# Patient Record
Sex: Female | Born: 1960 | Race: White | Hispanic: No | Smoking: Current some day smoker
Health system: Southern US, Community
[De-identification: ages and names within clinical notes are randomized; demographics above are authoritative.]

## PROBLEM LIST (undated history)

## (undated) DIAGNOSIS — K7460 Unspecified cirrhosis of liver: Secondary | ICD-10-CM

---

## 2004-10-30 ENCOUNTER — Encounter: Payer: Self-pay | Admitting: Family Medicine

## 2008-01-26 ENCOUNTER — Ambulatory Visit: Payer: Self-pay | Admitting: Family Medicine

## 2008-01-26 DIAGNOSIS — J449 Chronic obstructive pulmonary disease, unspecified: Secondary | ICD-10-CM

## 2008-01-26 DIAGNOSIS — B182 Chronic viral hepatitis C: Secondary | ICD-10-CM | POA: Insufficient documentation

## 2008-01-26 DIAGNOSIS — R519 Headache, unspecified: Secondary | ICD-10-CM | POA: Insufficient documentation

## 2008-01-26 DIAGNOSIS — F329 Major depressive disorder, single episode, unspecified: Secondary | ICD-10-CM

## 2008-01-26 DIAGNOSIS — R51 Headache: Secondary | ICD-10-CM

## 2008-01-26 DIAGNOSIS — K625 Hemorrhage of anus and rectum: Secondary | ICD-10-CM

## 2008-01-26 DIAGNOSIS — J4489 Other specified chronic obstructive pulmonary disease: Secondary | ICD-10-CM | POA: Insufficient documentation

## 2008-01-26 DIAGNOSIS — M549 Dorsalgia, unspecified: Secondary | ICD-10-CM | POA: Insufficient documentation

## 2008-01-26 DIAGNOSIS — J309 Allergic rhinitis, unspecified: Secondary | ICD-10-CM | POA: Insufficient documentation

## 2008-01-27 LAB — CONVERTED CEMR LAB
ALT: 96 units/L — ABNORMAL HIGH (ref 0–35)
AST: 115 units/L — ABNORMAL HIGH (ref 0–37)
Basophils Absolute: 0 10*3/uL (ref 0.0–0.1)
Basophils Relative: 0.7 % (ref 0.0–3.0)
CO2: 31 meq/L (ref 19–32)
Creatinine, Ser: 0.8 mg/dL (ref 0.4–1.2)
GFR calc Af Amer: 99 mL/min
Glucose, Bld: 87 mg/dL (ref 70–99)
Lymphocytes Relative: 32.8 % (ref 12.0–46.0)
MCHC: 35.4 g/dL (ref 30.0–36.0)
Monocytes Relative: 11.6 % (ref 3.0–12.0)
Neutro Abs: 1.8 10*3/uL (ref 1.4–7.7)
Platelets: 66 10*3/uL — ABNORMAL LOW (ref 150–400)
Potassium: 4.5 meq/L (ref 3.5–5.1)
Total Bilirubin: 1.1 mg/dL (ref 0.3–1.2)
WBC: 3.4 10*3/uL — ABNORMAL LOW (ref 4.5–10.5)

## 2008-02-13 ENCOUNTER — Ambulatory Visit: Payer: Self-pay | Admitting: Family Medicine

## 2008-02-15 ENCOUNTER — Telehealth (INDEPENDENT_AMBULATORY_CARE_PROVIDER_SITE_OTHER): Payer: Self-pay | Admitting: *Deleted

## 2008-04-03 ENCOUNTER — Encounter (INDEPENDENT_AMBULATORY_CARE_PROVIDER_SITE_OTHER): Payer: Self-pay | Admitting: *Deleted

## 2009-05-17 ENCOUNTER — Ambulatory Visit (HOSPITAL_COMMUNITY): Admission: RE | Admit: 2009-05-17 | Discharge: 2009-05-17 | Payer: Self-pay | Admitting: Neurosurgery

## 2009-05-27 ENCOUNTER — Observation Stay: Payer: Self-pay | Admitting: Student

## 2009-08-06 ENCOUNTER — Ambulatory Visit: Payer: Self-pay | Admitting: Pain Medicine

## 2009-08-09 ENCOUNTER — Ambulatory Visit: Payer: Self-pay | Admitting: Gastroenterology

## 2009-08-28 ENCOUNTER — Ambulatory Visit: Payer: Self-pay | Admitting: Internal Medicine

## 2009-09-18 ENCOUNTER — Ambulatory Visit: Payer: Self-pay | Admitting: Internal Medicine

## 2009-09-27 ENCOUNTER — Ambulatory Visit: Payer: Self-pay | Admitting: Internal Medicine

## 2010-08-29 ENCOUNTER — Ambulatory Visit: Payer: Self-pay | Admitting: Internal Medicine

## 2010-09-05 ENCOUNTER — Inpatient Hospital Stay: Payer: Self-pay | Admitting: Internal Medicine

## 2010-09-28 ENCOUNTER — Ambulatory Visit: Payer: Self-pay | Admitting: Internal Medicine

## 2011-10-19 IMAGING — CR DG CHEST 1V PORT
1 series · 1 of 1 positions shown · non-contrast
Comparison: none

REASON FOR EXAM: shortness of breath
COMMENTS:

[view not recorded]
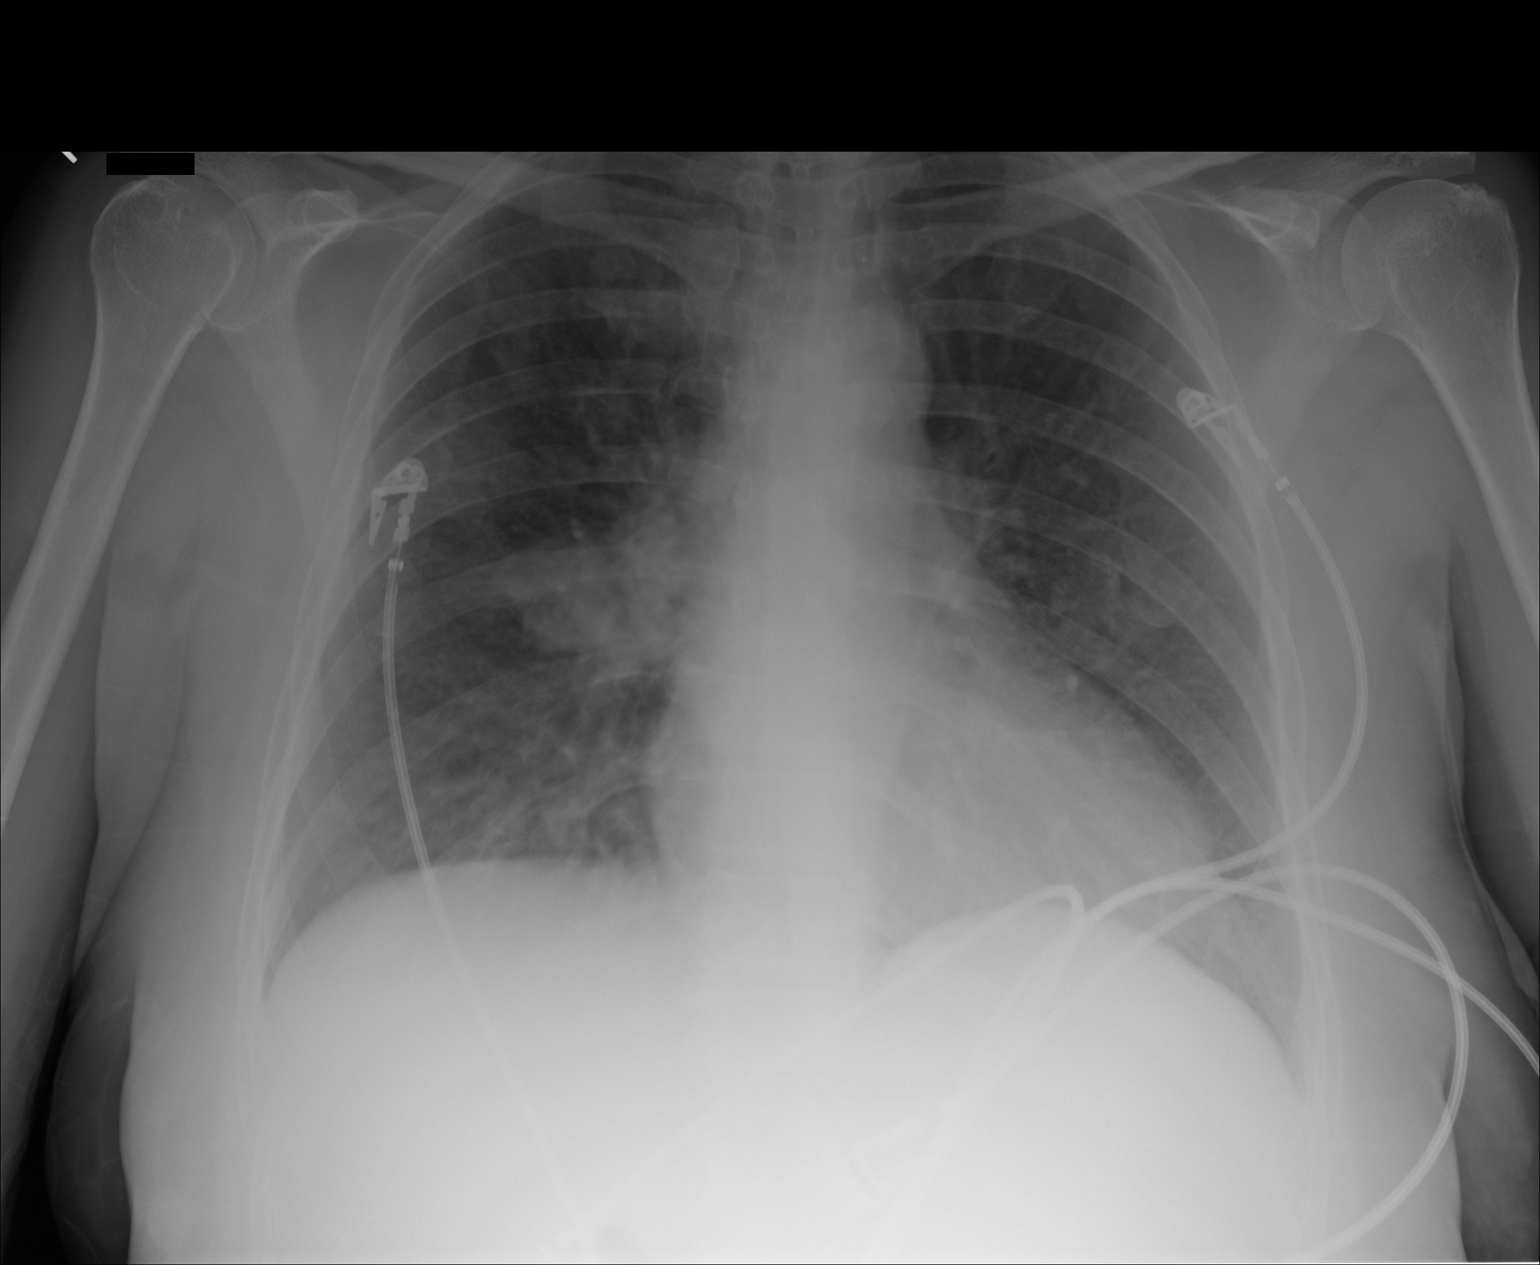

[1 of 1 positions shown; findings below may reference images not displayed]

PROCEDURE:     DXR - DXR PORTABLE CHEST SINGLE VIEW  - September 05, 2010  [DATE]

RESULT:     Comparison is made to the prior exam of 05/27/2009. There is
increased density about the right hilum. Pneumonia would be the first
consideration. Continued follow-up until clear is recommended. Heart size is
normal. Monitoring electrodes are present. No pleural effusion or frank
pulmonary edema is noted.
IMPRESSION: 1.     There is increased density about the right hilar area. Pneumonia
would be the first consideration as to etiology. Follow-up examination until
clear is recommended since underlying mass cannot be entirely excluded at
this point.

## 2011-10-19 IMAGING — CT CT CHEST W/ CM
1 of 2 series · 14 of 32 positions shown, 18 images · IV contrast (APPLIED)
Comparison: none

REASON FOR EXAM: hypoxia and respiratory distress
COMMENTS:

PROCEDURE:     CT  - CT CHEST (FOR PE) W  - September 05, 2010  [DATE]
RESULT:     Comparison: Portable chest radiograph performed same day
TECHNIQUE: Multiple thin section axial images were obtained from the lung
apices to the upper abdomen following 100 ml Isovue 370 intravenous
contrast, according to the PE protocol. These images were also reviewed on a
[employer] multiplanar work station.

[Series 5: lung windows · axial · 0.74mm/px · z∈[+232,+496]mm · 14 of 106 slices shown, 18 images]
[im 9/106  mediastinal]
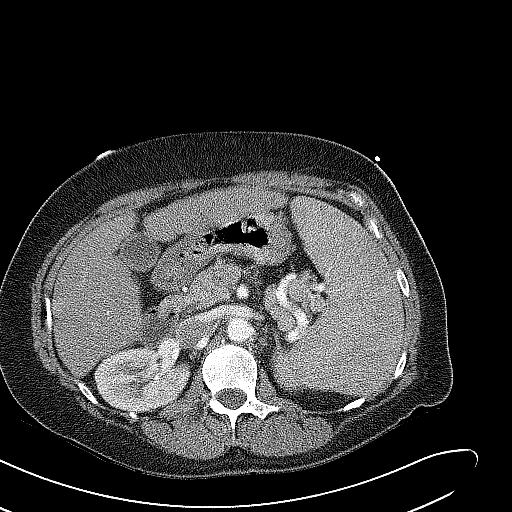
[im 9/106  lung]
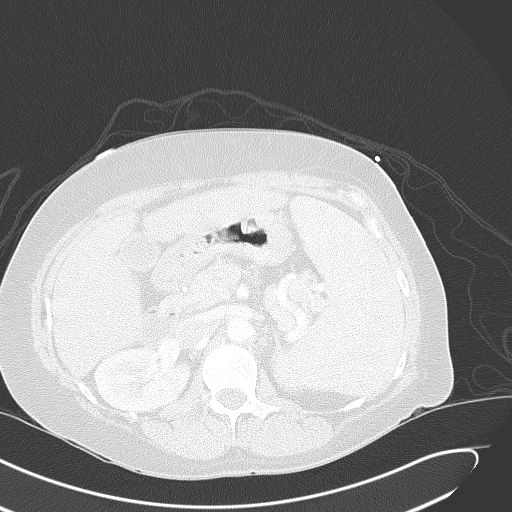
[im 17/106  lung]
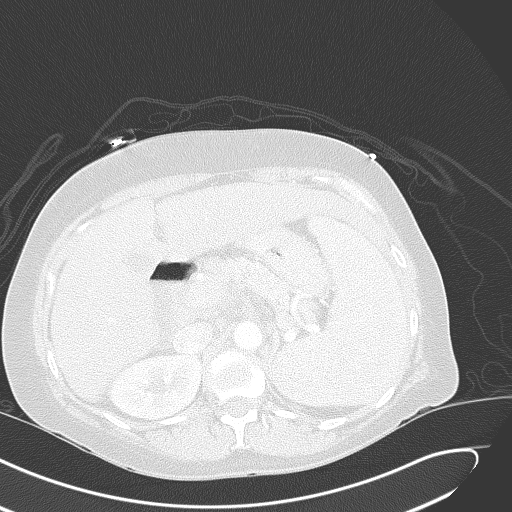
[im 25/106  lung]
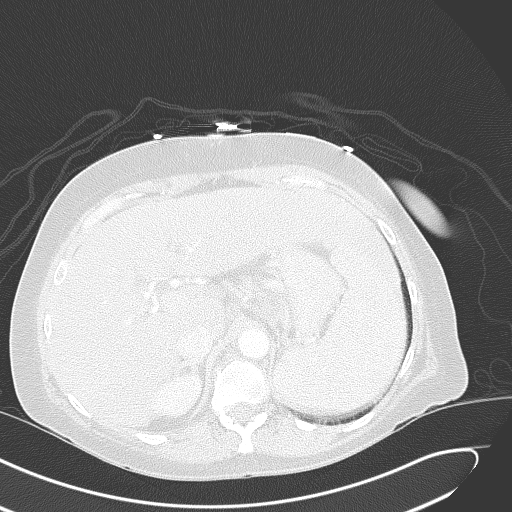
[im 33/106  lung]
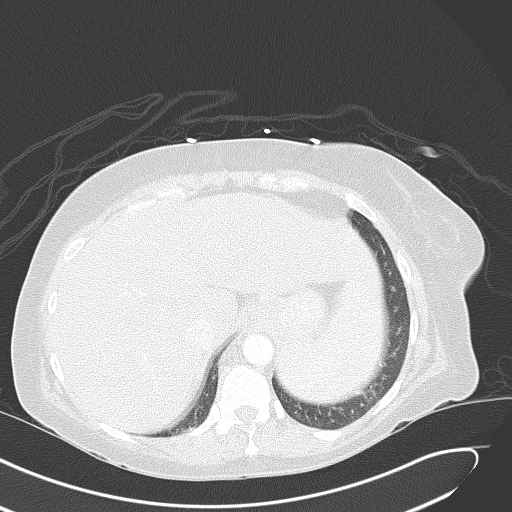
[im 41/106  mediastinal]
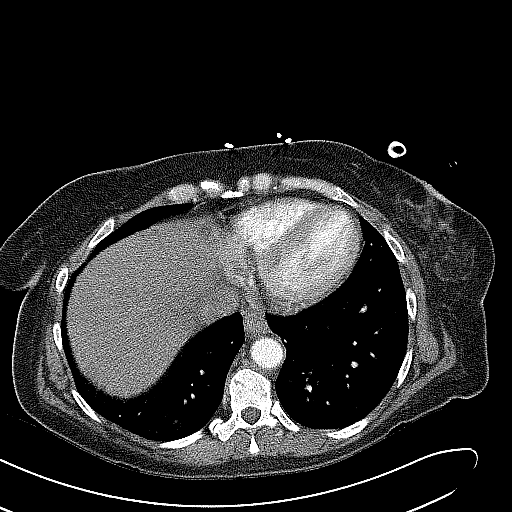
[im 41/106  lung]
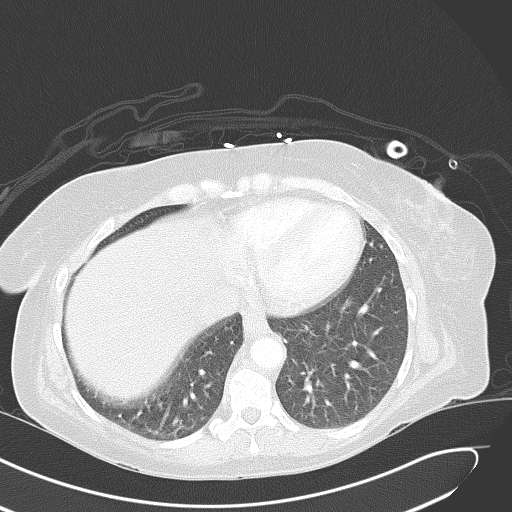
[im 49/106  lung]
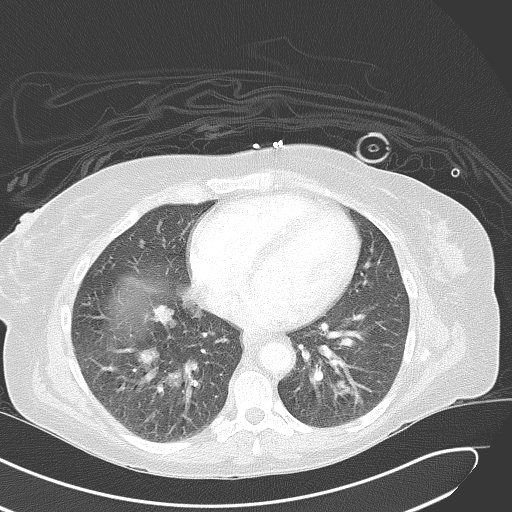
[im 50/106  lung]
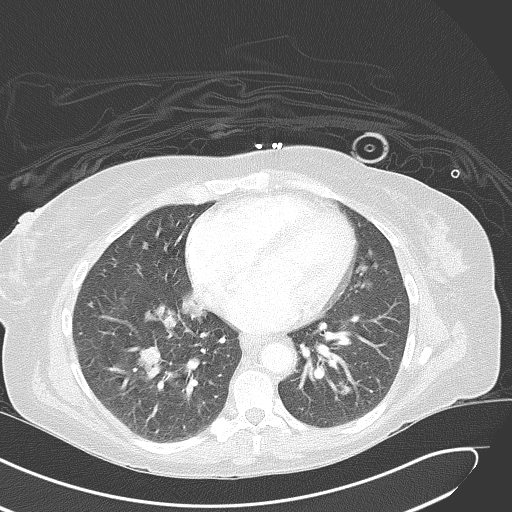
[im 53/106  lung]
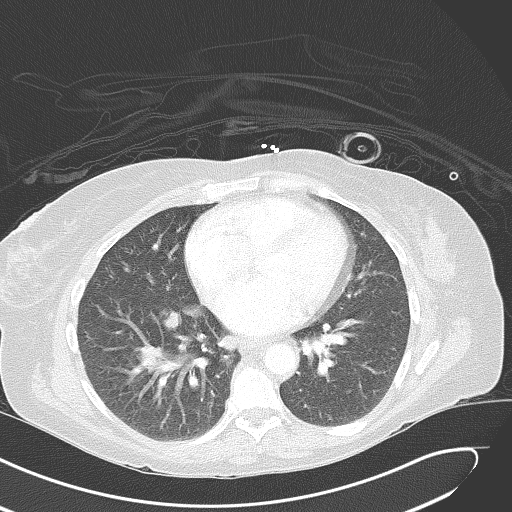
[im 57/106  mediastinal]
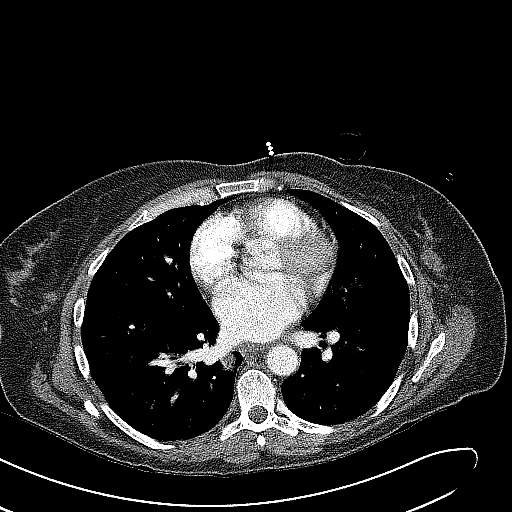
[im 57/106  lung]
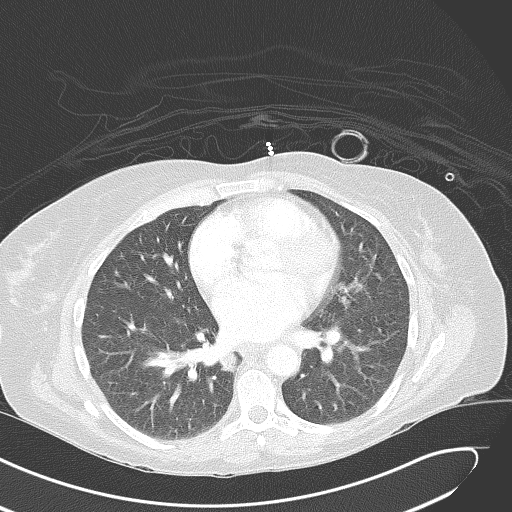
[im 65/106  lung]
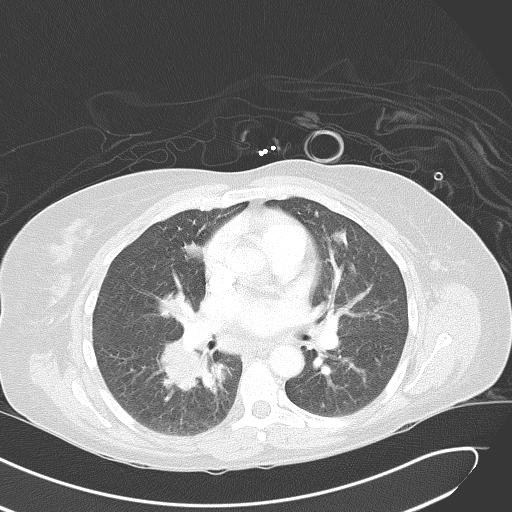
[im 73/106  lung]
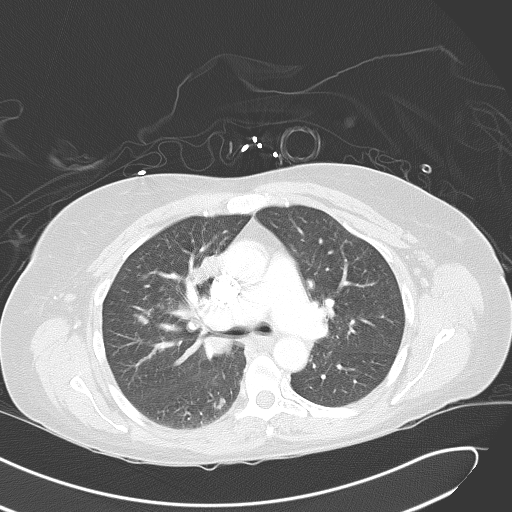
[im 81/106  lung]
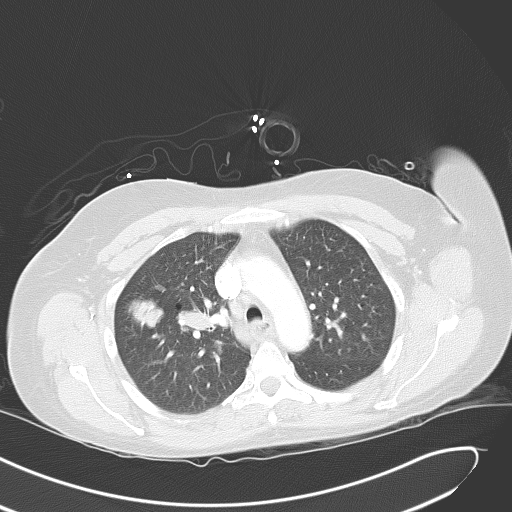
[im 89/106  mediastinal]
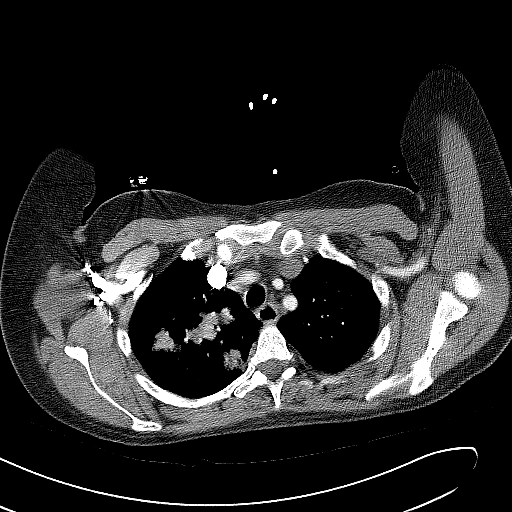
[im 89/106  lung]
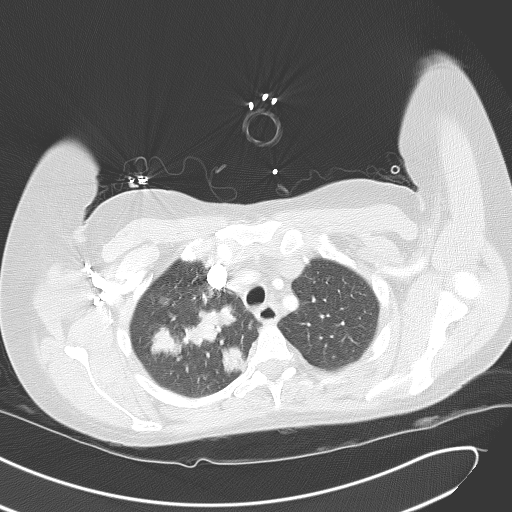
[im 97/106  lung]
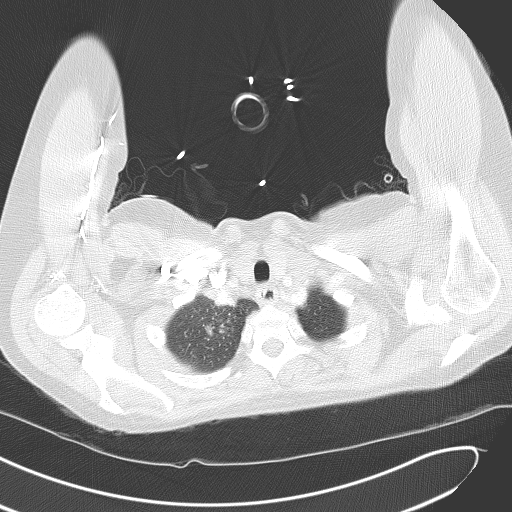

[14 of 32 positions shown; findings below may reference images not displayed]

FINDINGS: There are several mildly prominent mediastinal lymph nodes, which are not
pathologically enlarged. No hilar or axillary lymphadenopathy identified.
Opacities adjacent to the right hilum are felt to be secondary to pulmonary
opacities and not enlarged hilar lymph nodes. The spleen is enlarged.

The thoracic aorta is normal in caliber. Evaluation for pulmonary embolus is
very limited secondary to respiratory motion artifact. No central pulmonary
embolus identified.

There are heterogeneous opacities throughout the right upper lobe, right
lower lobe, and to a lesser extent the left upper lobe. These findings are
nonspecific, but in the acute setting, pneumonia is favored. A few
heterogeneous opacities are seen in the left lower lobe. The central airways
are patent.

No aggressive lytic or sclerotic osseous lesions identified.
IMPRESSION: 1. Evaluation for pulmonary embolus is very limited secondary to respiratory
motion artifact. No central pulmonary embolus identified.
2. Heterogeneous opacities throughout the lungs, greatest in the right upper
and right lower lobe. These are nonspecific. Infection is favored in the
acute setting. Given the somewhat nodular appearance, atypical infection is
of different consideration. Followup chest radiographs are recommended to
ensure resolution.
3. Splenomegaly, which is nonspecific.

## 2012-11-29 ENCOUNTER — Ambulatory Visit: Payer: Self-pay

## 2014-01-10 ENCOUNTER — Emergency Department: Payer: Self-pay | Admitting: Emergency Medicine

## 2014-01-10 LAB — COMPREHENSIVE METABOLIC PANEL
ALBUMIN: 3.4 g/dL (ref 3.4–5.0)
ALK PHOS: 58 U/L
ANION GAP: 7 (ref 7–16)
BUN: 7 mg/dL (ref 7–18)
Bilirubin,Total: 1.1 mg/dL — ABNORMAL HIGH (ref 0.2–1.0)
CALCIUM: 8.3 mg/dL — AB (ref 8.5–10.1)
CO2: 26 mmol/L (ref 21–32)
Chloride: 109 mmol/L — ABNORMAL HIGH (ref 98–107)
Creatinine: 0.85 mg/dL (ref 0.60–1.30)
EGFR (African American): >60
GLUCOSE: 149 mg/dL — AB (ref 65–99)
OSMOLALITY: 284 (ref 275–301)
POTASSIUM: 3.7 mmol/L (ref 3.5–5.1)
SGOT(AST): 55 U/L — ABNORMAL HIGH (ref 15–37)
SGPT (ALT): 53 U/L
Sodium: 142 mmol/L (ref 136–145)
Total Protein: 8.2 g/dL (ref 6.4–8.2)

## 2014-01-10 LAB — CBC WITH DIFFERENTIAL/PLATELET
BASOS ABS: 0 10*3/uL (ref 0.0–0.1)
Basophil %: 1 %
EOS PCT: 4.8 %
Eosinophil #: 0.1 10*3/uL (ref 0.0–0.7)
HCT: 45.1 % (ref 35.0–47.0)
HGB: 15 g/dL (ref 12.0–16.0)
Lymphocyte #: 0.7 10*3/uL — ABNORMAL LOW (ref 1.0–3.6)
Lymphocyte %: 26.6 %
MCH: 34.1 pg — AB (ref 26.0–34.0)
MCHC: 33.1 g/dL (ref 32.0–36.0)
MCV: 103 fL — ABNORMAL HIGH (ref 80–100)
MONO ABS: 0.3 x10 3/mm (ref 0.2–0.9)
Monocyte %: 12.7 %
NEUTROS ABS: 1.5 10*3/uL (ref 1.4–6.5)
NEUTROS PCT: 54.9 %
PLATELETS: 69 10*3/uL — AB (ref 150–440)
RBC: 4.39 10*6/uL (ref 3.80–5.20)
RDW: 13.8 % (ref 11.5–14.5)
WBC: 2.7 10*3/uL — ABNORMAL LOW (ref 3.6–11.0)

## 2014-01-10 LAB — TROPONIN I

## 2016-02-08 IMAGING — CR DG CHEST 2V
1 series · 2 of 2 positions shown · non-contrast
Comparison: 09/05/2010

CLINICAL DATA: Cough, fever, shortness of breath and fatigue for 1
week.

EXAM:
CHEST  2 VIEW

[Series 1: dxr chest pa (or ap) and lateral · 0.14mm/px · 2 of 2 slices shown]
[im 1/2]
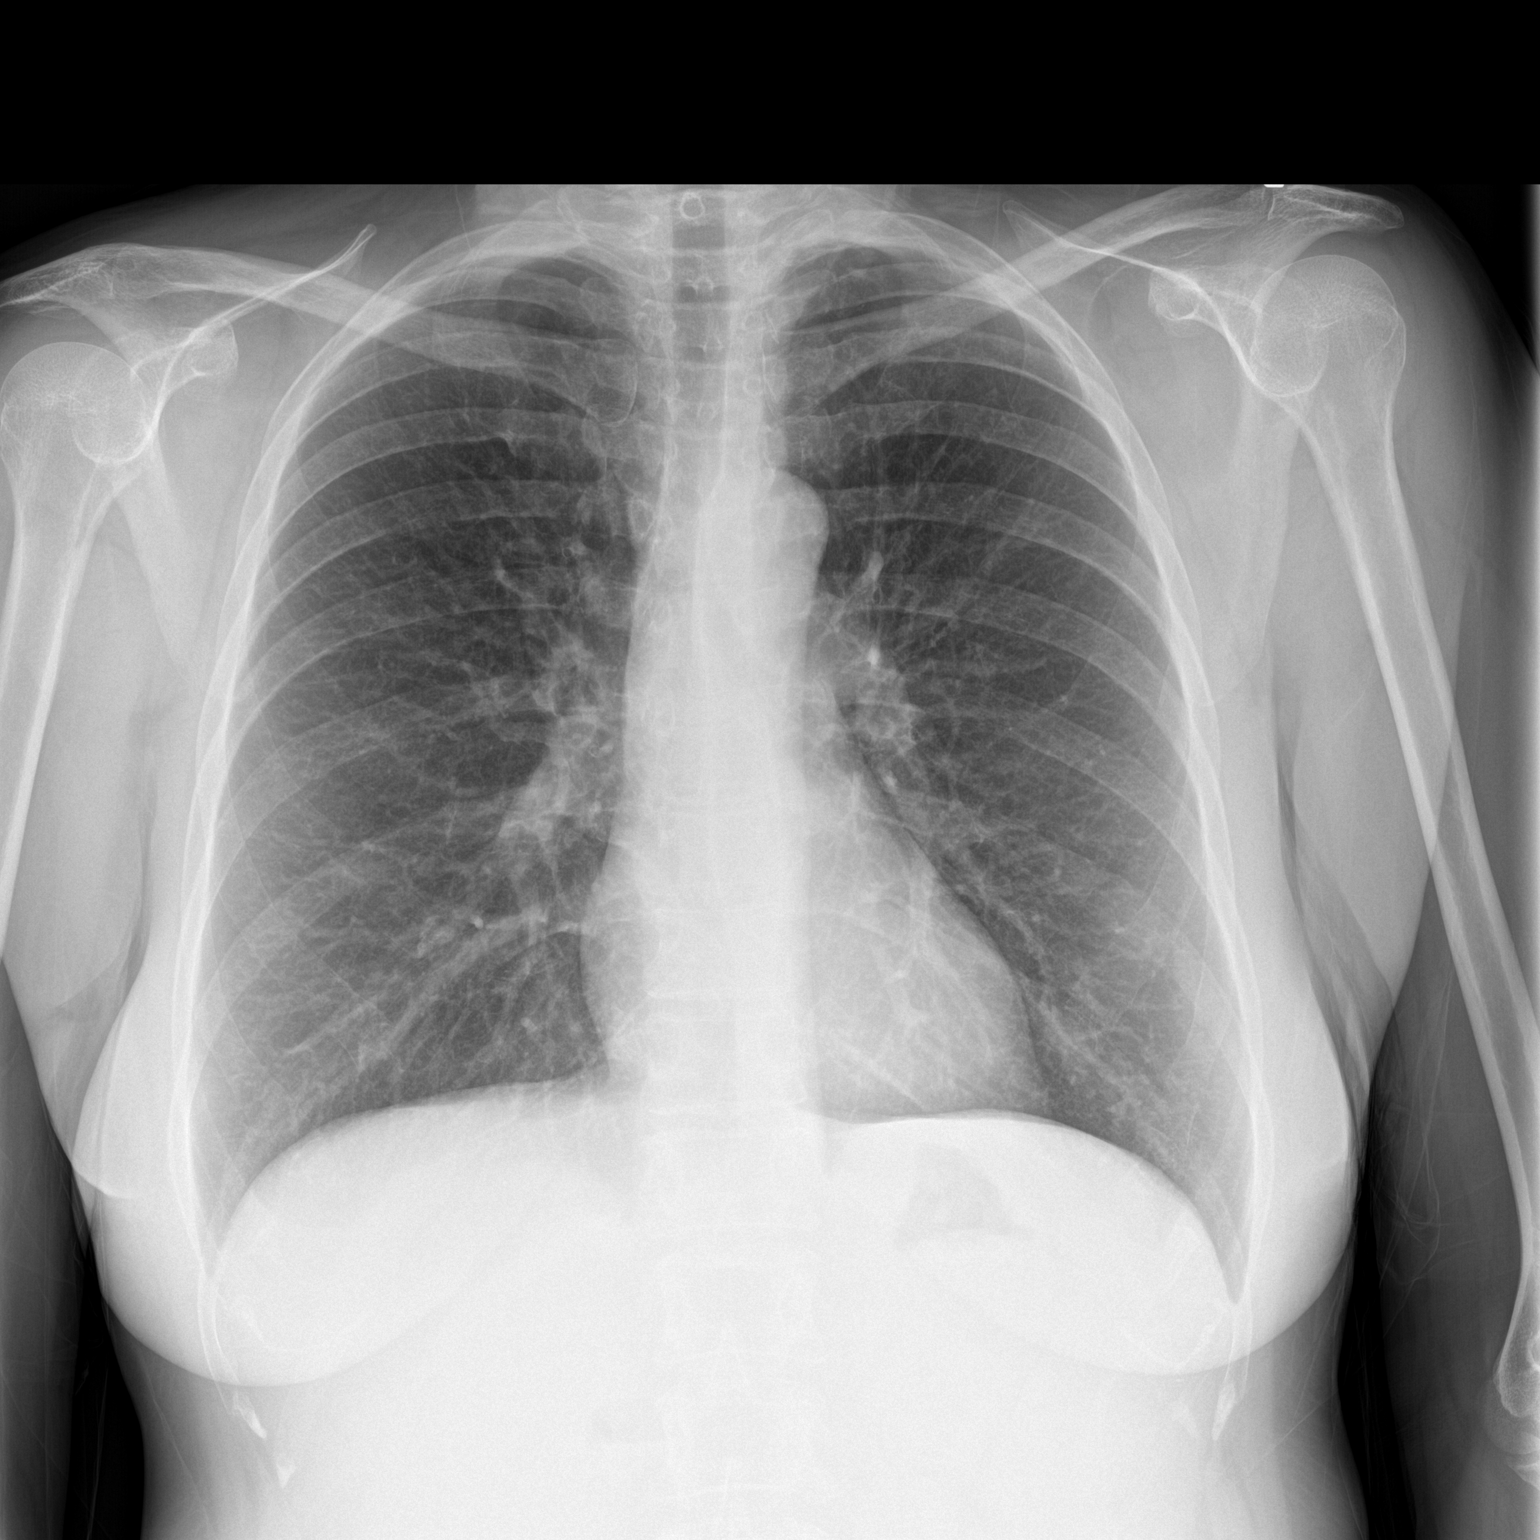
[im 2/2]
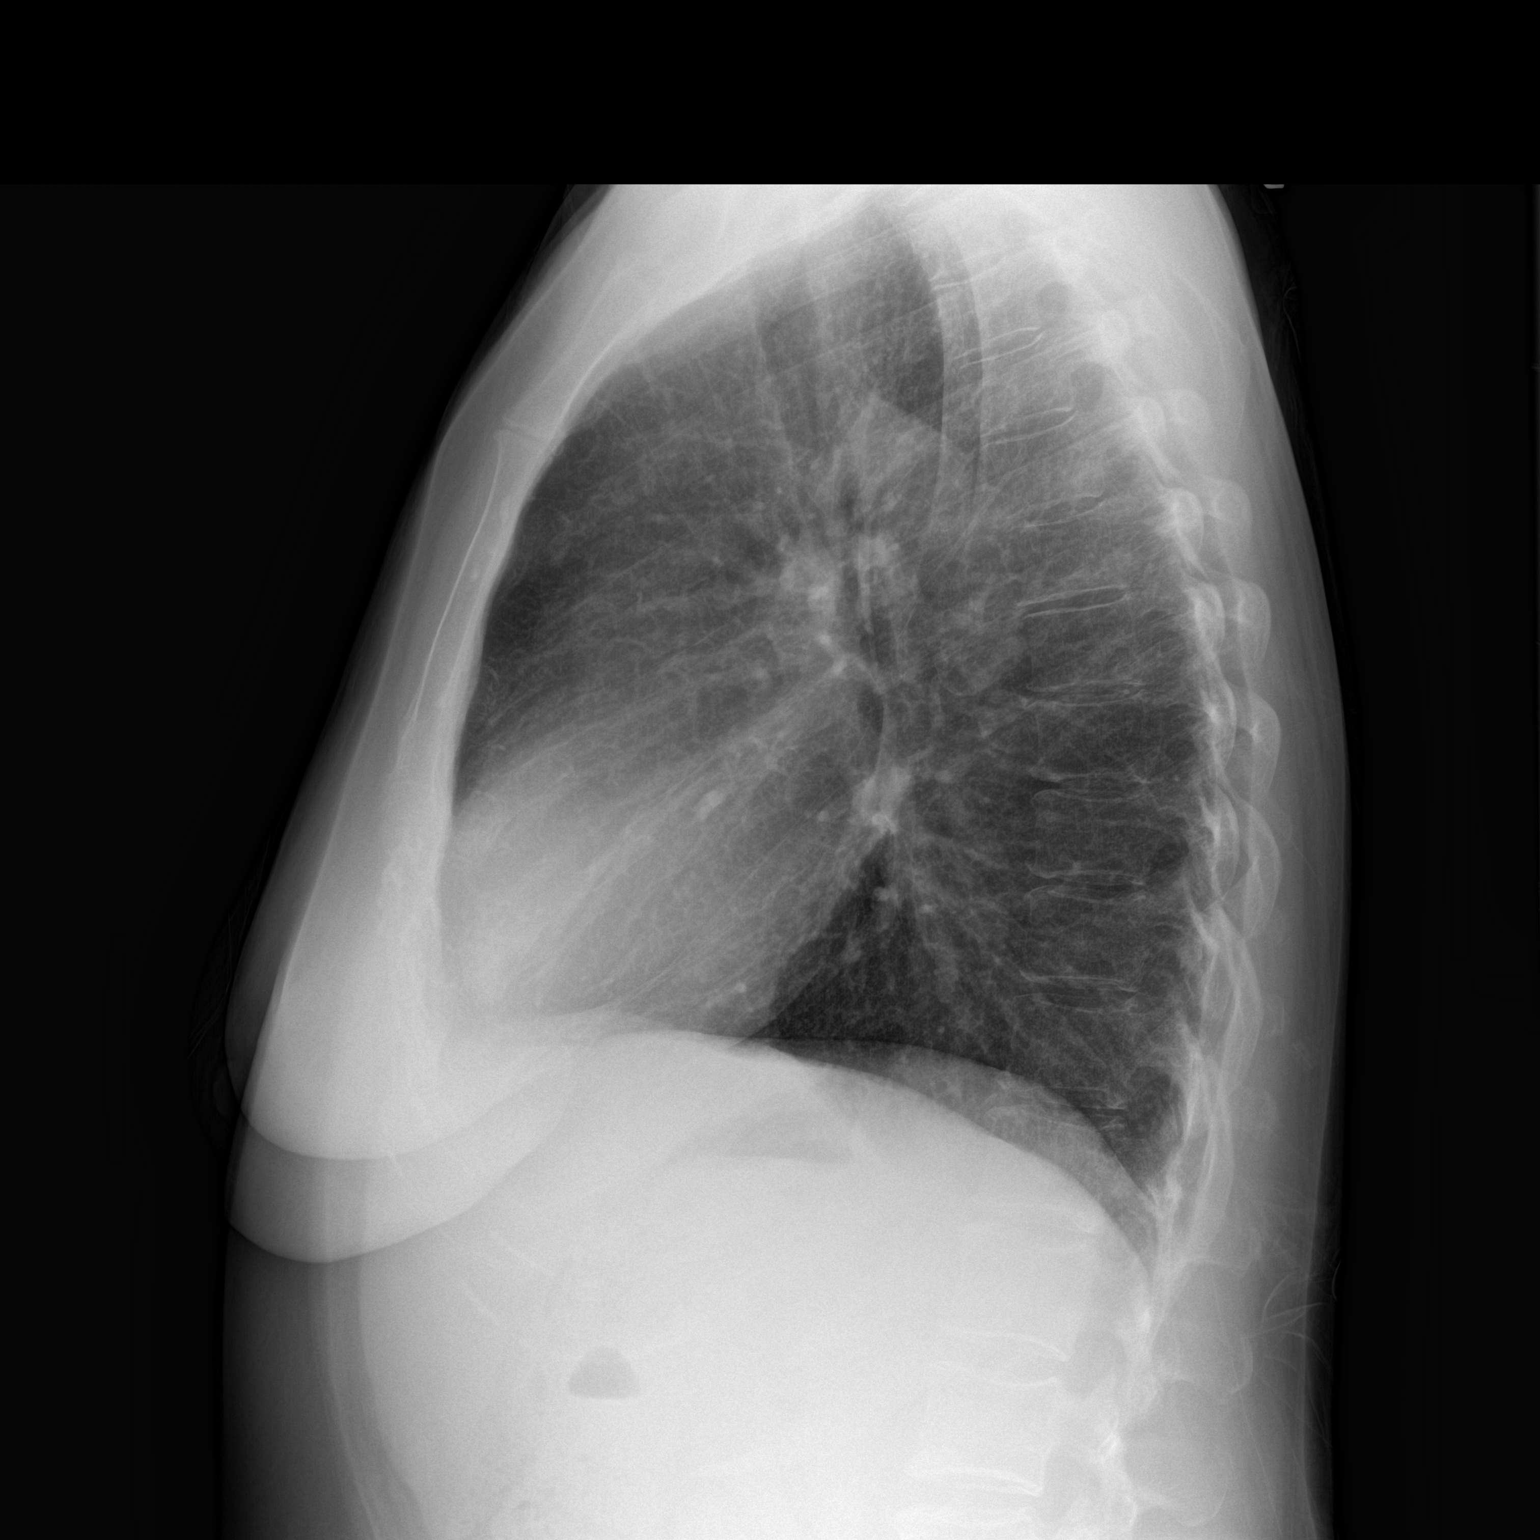

[2 of 2 positions shown; findings below may reference images not displayed]

FINDINGS: The heart size and mediastinal contours are within normal limits.
Both lungs are clear. The visualized skeletal structures are
unremarkable.
IMPRESSION: No active cardiopulmonary disease.

## 2016-11-18 ENCOUNTER — Encounter: Payer: Self-pay | Admitting: Emergency Medicine

## 2016-11-18 ENCOUNTER — Emergency Department
Admission: EM | Admit: 2016-11-18 | Discharge: 2016-11-18 | Disposition: A | Discharge status: Home / Self Care | Payer: Medicare Other | Attending: Emergency Medicine | Admitting: Emergency Medicine

## 2016-11-18 DIAGNOSIS — R4182 Altered mental status, unspecified: Secondary | ICD-10-CM | POA: Diagnosis present

## 2016-11-18 DIAGNOSIS — T402X1A Poisoning by other opioids, accidental (unintentional), initial encounter: Secondary | ICD-10-CM | POA: Diagnosis not present

## 2016-11-18 DIAGNOSIS — F17290 Nicotine dependence, other tobacco product, uncomplicated: Secondary | ICD-10-CM | POA: Insufficient documentation

## 2016-11-18 DIAGNOSIS — J449 Chronic obstructive pulmonary disease, unspecified: Secondary | ICD-10-CM | POA: Insufficient documentation

## 2016-11-18 DIAGNOSIS — F1410 Cocaine abuse, uncomplicated: Secondary | ICD-10-CM | POA: Insufficient documentation

## 2016-11-18 DIAGNOSIS — E869 Volume depletion, unspecified: Secondary | ICD-10-CM | POA: Diagnosis not present

## 2016-11-18 DIAGNOSIS — F17210 Nicotine dependence, cigarettes, uncomplicated: Secondary | ICD-10-CM | POA: Diagnosis not present

## 2016-11-18 DIAGNOSIS — Z79899 Other long term (current) drug therapy: Secondary | ICD-10-CM | POA: Diagnosis not present

## 2016-11-18 DIAGNOSIS — B1710 Acute hepatitis C without hepatic coma: Secondary | ICD-10-CM | POA: Diagnosis not present

## 2016-11-18 HISTORY — DX: Unspecified cirrhosis of liver: K74.60

## 2016-11-18 LAB — COMPREHENSIVE METABOLIC PANEL
ALBUMIN: 3.6 g/dL (ref 3.5–5.0)
ALT: 41 U/L (ref 14–54)
ANION GAP: 9 (ref 5–15)
AST: 56 U/L — ABNORMAL HIGH (ref 15–41)
Alkaline Phosphatase: 46 U/L (ref 38–126)
BILIRUBIN TOTAL: 0.8 mg/dL (ref 0.3–1.2)
BUN: 13 mg/dL (ref 6–20)
CHLORIDE: 105 mmol/L (ref 101–111)
CO2: 24 mmol/L (ref 22–32)
Calcium: 8.6 mg/dL — ABNORMAL LOW (ref 8.9–10.3)
Creatinine, Ser: 1.3 mg/dL — ABNORMAL HIGH (ref 0.44–1.00)
GFR calc Af Amer: 53 mL/min — ABNORMAL LOW (ref >60)
GFR calc non Af Amer: 45 mL/min — ABNORMAL LOW (ref >60)
GLUCOSE: 274 mg/dL — AB (ref 65–99)
POTASSIUM: 3.8 mmol/L (ref 3.5–5.1)
SODIUM: 138 mmol/L (ref 135–145)
TOTAL PROTEIN: 7.5 g/dL (ref 6.5–8.1)

## 2016-11-18 LAB — CBC WITH DIFFERENTIAL/PLATELET
BASOS PCT: 0 %
Basophils Absolute: 0 10*3/uL (ref 0–0.1)
EOS ABS: 0.1 10*3/uL (ref 0–0.7)
Eosinophils Relative: 1 %
HCT: 39.8 % (ref 35.0–47.0)
Hemoglobin: 13.9 g/dL (ref 12.0–16.0)
Lymphocytes Relative: 9 %
Lymphs Abs: 0.6 10*3/uL — ABNORMAL LOW (ref 1.0–3.6)
MCH: 34.4 pg — ABNORMAL HIGH (ref 26.0–34.0)
MCHC: 34.8 g/dL (ref 32.0–36.0)
MCV: 98.6 fL (ref 80.0–100.0)
MONO ABS: 0.2 10*3/uL (ref 0.2–0.9)
MONOS PCT: 3 %
Neutro Abs: 6.3 10*3/uL (ref 1.4–6.5)
Neutrophils Relative %: 87 %
PLATELETS: 73 10*3/uL — AB (ref 150–440)
RBC: 4.04 MIL/uL (ref 3.80–5.20)
RDW: 16 % — AB (ref 11.5–14.5)
WBC: 7.2 10*3/uL (ref 3.6–11.0)

## 2016-11-18 LAB — URINALYSIS, ROUTINE W REFLEX MICROSCOPIC
BILIRUBIN URINE: NEGATIVE
Bacteria, UA: NONE SEEN
GLUCOSE, UA: 150 mg/dL — AB
KETONES UR: NEGATIVE mg/dL
Leukocytes, UA: NEGATIVE
NITRITE: NEGATIVE
PH: 5 (ref 5.0–8.0)
Protein, ur: NEGATIVE mg/dL
Specific Gravity, Urine: 1.012 (ref 1.005–1.030)
Squamous Epithelial / LPF: NONE SEEN

## 2016-11-18 LAB — URINE DRUG SCREEN, QUALITATIVE (ARMC ONLY)
Amphetamines, Ur Screen: NOT DETECTED
BARBITURATES, UR SCREEN: NOT DETECTED
BENZODIAZEPINE, UR SCRN: NOT DETECTED
CANNABINOID 50 NG, UR ~~LOC~~: NOT DETECTED
Cocaine Metabolite,Ur ~~LOC~~: POSITIVE — AB
MDMA (Ecstasy)Ur Screen: NOT DETECTED
Methadone Scn, Ur: NOT DETECTED
OPIATE, UR SCREEN: POSITIVE — AB
PHENCYCLIDINE (PCP) UR S: NOT DETECTED
Tricyclic, Ur Screen: NOT DETECTED

## 2016-11-18 LAB — AMMONIA: Ammonia: 47 umol/L — ABNORMAL HIGH (ref 9–35)

## 2016-11-18 LAB — ETHANOL: Alcohol, Ethyl (B): <5 mg/dL (ref <5)

## 2016-11-18 LAB — LACTIC ACID, PLASMA
LACTIC ACID, VENOUS: 2.5 mmol/L — AB (ref 0.5–1.9)
LACTIC ACID, VENOUS: 1 mmol/L (ref 0.5–1.9)

## 2016-11-18 LAB — CK: Total CK: 169 U/L (ref 38–234)

## 2016-11-18 MED ORDER — NALOXONE HCL 4 MG/0.1ML NA LIQD: NASAL | 1 refills | Status: AC

## 2016-11-18 MED ORDER — NALOXONE HCL 4 MG/0.1ML NA LIQD
1.0000 | Freq: Once | NASAL | Status: AC
  Administered 2016-11-18: 1 via NASAL
  Filled 2016-11-18: qty 4

## 2016-11-18 MED ORDER — SODIUM CHLORIDE 0.9 % IV BOLUS (SEPSIS)
1000.0000 mL | INTRAVENOUS | Status: AC
  Administered 2016-11-18: 1000 mL via INTRAVENOUS

## 2016-11-18 NOTE — ED Provider Notes (Signed)
[facility] Emergency Department Provider Note  ____________________________________________   First MD Initiated Contact with Patient 11/18/16 1109     (approximate)  I have reviewed the triage vital signs and the nursing notes.   HISTORY  Chief Complaint Altered Mental Status  Level 5 caveat:  history/ROS limited by presumed acute intoxication  HPI Sierra Patterson is a 56 y.o. female with no recent medical history in the system but he reportedly has a history of hepatitis C according to notes from years ago from [facility].  She presents by EMS today after they were called out for the patient's unresponsiveness.  The patient states that she remembers getting up to go to the bathroom at about 5:00 AM and the next thing she remembers is being sick in her house although she is not clear if she means this was after EMS arrived and gave her Narcan.   Little additional history is provided except that EMS reports that when they arrived the patient was completely unresponsive with minimal respiratory effort and pinpoint pupils.  She received 2 mg of intranasal Narcan and 4 mg of IV Narcan, and after all that Narcan and she is now awake, alert, and reporting feeling cold all over and nauseated but is otherwise alert and oriented.  She adamantly denies taking any medications although she does report that sometimes she takes medications not prescribed to her; when I ask her what medications, she shakes her head and then states she does not know.  She reports that she "feels fine" now and wants to go.  She denies fever/chills, chest pain, shortness of breath, abdominal pain, dysuria.  She does not know whether she was found in her bed or on her floor or for how long she was unresponsive.  She does not appear to have any traumatic injuries and complains of no headache, neck pain nor pain in any of her extremities.  Her aunt is with her at her bedside and participating in the conversation  with the patient's permission although she does not have any additional information.    Past Medical History:  Diagnosis Date  . Cirrhosis [facility])     Patient Active Problem List   Diagnosis Date Noted  . CHRONIC HEPATITIS C WITHOUT MENTION HEPATIC COMA 01/26/2008  . DEPRESSION 01/26/2008  . ALLERGIC RHINITIS 01/26/2008  . COPD 01/26/2008  . RECTAL BLEEDING 01/26/2008  . BACK PAIN, CHRONIC 01/26/2008  . HEADACHE 01/26/2008    History reviewed. No pertinent surgical history.  Prior to Admission medications   Medication Sig Start Date End Date Taking? Authorizing Provider  clonazePAM (KLONOPIN) 0.5 MG tablet Take 1 tablet by mouth 2 (two) times daily. 08/23/12   [provider]  furosemide (LASIX) 20 MG tablet Take 1 tablet by mouth 2 (two) times daily. 11/25/12   [provider]  gabapentin (NEURONTIN) 300 MG capsule Take 1 capsule by mouth 3 (three) times daily. 08/23/12   [provider]  metoprolol tartrate (LOPRESSOR) 50 MG tablet Take 1 tablet by mouth 2 (two) times daily.  08/23/12   [provider]  naloxone [facility]) nasal spray 4 mg/0.1 mL Administer 1 spray as needed into one nostril in the case of decreased breathing and responsiveness thought to be due to narcotics overdose.  Call 911 and repeat dose every 2 to 3 minutes in alternating nostrils until EMS arrives. 11/18/16   Loleta Rose, MD  OMEPRAZOLE PO Take 1 capsule by mouth 2 (two) times daily.  [provider]  SM MULTIPLE VITAMINS/IRON TABS Take 1 tablet by mouth daily. 08/08/10   [provider]    Allergies Bee venom and Ibuprofen  History reviewed. No pertinent family history.  Social History Social History  Substance Use Topics  . Smoking status: Current Some Day Smoker    Packs/day: 0.50    Types: Cigarettes  . Smokeless tobacco: Current User    Types: Snuff  . Alcohol use Yes     Comment: Occ    Review of Systems Level 5 caveat:  history/ROS  limited by presumed acute intoxication  Constitutional: No fever/chills, but feels cold Eyes: No visual changes. ENT: No sore throat. Cardiovascular: Denies chest pain. Respiratory: Denies shortness of breath. Gastrointestinal: No abdominal pain.  Nausea, some vomiting after Narcan.  No diarrhea.  No constipation. Genitourinary: Negative for dysuria. Musculoskeletal: Negative for neck pain.  Negative for back pain. Integumentary: Negative for rash. Neurological: Negative for headaches, focal weakness or numbness.   ____________________________________________   PHYSICAL EXAM:  VITAL SIGNS: ED Triage Vitals  Enc Vitals Group     BP 11/18/16 1056 119/72     Pulse Rate 11/18/16 1056 93     Resp 11/18/16 1056 (!) 22     Temp 11/18/16 1056 98.2 F (36.8 C)     Temp Source 11/18/16 1056 Oral     SpO2 11/18/16 1047 99 %     Weight 11/18/16 1048 72.6 kg (160 lb)     Height 11/18/16 1048 1.575 m (5\' 2" )     Head Circumference --      Peak Flow --      Pain Score --      Pain Loc --      Pain Edu? --      Excl. in GC? --     Constitutional: Alert and oriented. Disheveled but in no acute distress.   Eyes: Conjunctivae are normal.  Moderately myotic pupils, not reactive to light.  EOMI. Head: Atraumatic. Nose: No congestion/rhinnorhea. Mouth/Throat: Mucous membranes are moist. Neck: No stridor.  No meningeal signs.  No cervical spine tenderness to palpation. Cardiovascular: Normal rate, regular rhythm. Good peripheral circulation. Grossly normal heart sounds. Respiratory: Normal respiratory effort.  No retractions.  Mild expiratory wheezing throughout lung fields but without respiratory distress Gastrointestinal: Soft and nontender. No distention.  Musculoskeletal: No lower extremity tenderness nor edema. No gross deformities of extremities. Neurologic:  Normal speech and language. No gross focal neurologic deficits are appreciated.  Skin:  Skin is warm, dry and intact. No rash  noted. Psychiatric: The patient is irritable and has no memory of the last 6 or 7 hours, alert and oriented after 6 mg of Narcan, adamantly denies intentional or accidental overdose or any drug ingestion.  ____________________________________________   LABS (all labs ordered are listed, but only abnormal results are displayed)  Labs Reviewed  URINALYSIS, ROUTINE W REFLEX MICROSCOPIC - Abnormal; Notable for the following:       Result Value   Color, Urine YELLOW (*)    APPearance CLEAR (*)    Glucose, UA 150 (*)    Hgb urine dipstick MODERATE (*)    All other components within normal limits  URINE DRUG SCREEN, QUALITATIVE ([facility] ONLY) - Abnormal; Notable for the following:    Cocaine Metabolite,Ur [place] POSITIVE (*)    Opiate, Ur Screen POSITIVE (*)    All other components within normal limits  CBC WITH DIFFERENTIAL/PLATELET - Abnormal; Notable for the following:    MCH 34.4 (*)  RDW 16.0 (*)    Platelets 73 (*)    Lymphs Abs 0.6 (*)    All other components within normal limits  COMPREHENSIVE METABOLIC PANEL - Abnormal; Notable for the following:    Glucose, Bld 274 (*)    Creatinine, Ser 1.30 (*)    Calcium 8.6 (*)    AST 56 (*)    GFR calc non Af Amer 45 (*)    GFR calc Af Amer 53 (*)    All other components within normal limits  AMMONIA - Abnormal; Notable for the following:    Ammonia 47 (*)    All other components within normal limits  LACTIC ACID, PLASMA - Abnormal; Notable for the following:    Lactic Acid, Venous 2.5 (*)    All other components within normal limits  ETHANOL  CK  LACTIC ACID, PLASMA   ____________________________________________  EKG  EKG not ordered by ED physician ____________________________________________  RADIOLOGY   No results found.  ____________________________________________   PROCEDURES  Critical Care performed: No   Procedure(s) performed:   Procedures   ____________________________________________   INITIAL  IMPRESSION / ASSESSMENT AND PLAN / ED COURSE  Pertinent labs & imaging results that were available during my care of the patient were reviewed by me and considered in my medical decision making (see chart for details).  Concerned about the strong probability that the patient is acutely intoxicated on some form of opioid which rendered her unresponsive for an unknown period of time and required a full 6 mg of Narcan to reverse.  It is my opinion that at this moment she does not have the capacity to make any medical decisions.  However after my discussion with her and my explanation that we need to further evaluate her to determine why she was so ill this morning, she is willing to stay voluntarily.  If she tries to leave prior to my medical screening exam and period of observation, I will place her under involuntary commitment.  Her aunt is in the room and agrees with the need for further evaluation.  The patient is not in any acute distress at this time.  Her vital signs are stable although her fingerstick blood sugar was in the 400s which she states is not normal.  I will give her a liter of fluids and keep her on the cardiac monitor and pulse oximeter and we will watch her carefully as the Narcan is metabolized to see if she again becomes unresponsive.  Given her history of hep C years ago, in addition to standard blood work also check an ammonia level, lactic acid and CK given that we do not know for how long she was unresponsive, and urinalysis and urine drug screen.  There is no indication for emergent advanced imaging at this time.   Clinical Course as of Nov 18 2321  Wed Nov 18, 2016  1115 OBSERVATION CARE: This patient is being placed under observation care for the following reasons: Questionable overdose observed to r/o significant toxicity   [CF]  1228 The patient is noted to have a lactate>2. With the current information available to me, I don't think the patient is in severe sepsis nor  septic shock. The lactate>2, is related to period of respiratory distress/ respiratory failure and probable decreased oral intake / dehydration. Lactic Acid, Venous: (!!) 2.5 [CF]  1344 The patient's urine drug screen is positive for both cocaine and opiates.  Her blood pressure started to drop and is now in the  low 90s systolic and she reports that she is feeling sleepy but "I do not know why".  I explained to her that her Narcan is wearing off and I told her and her and aunt (with the patient's permission) about her positive cocaine and opiate tests.  She became upset and states "I do not take enough to make me overdose".  She then stated that she does not have anything to take.  I explained we need to continue providing IV fluids and will continue to observe her to see if she needs more Narcan or any additional care for what appears to be an unintentional overdose.  She is tearful at this time but says that she understands and her aunt agrees as well.  [CF]  1500 Blood pressure has improved to the 140s over 80s.  The patient wants to eat which I will allow her to do.  We need to repeat a lactic acid after her fluid boluses but I anticipate she will be appropriate for discharge.  I still do not feel that she meets involuntary commitment nor inpatient criteria and it is reassuring that she is now more awake and alert even though the Narcan should have metabolized.  [CF]  1558 Patient ate a meal, is awake and alert.  Awaiting second lactic acid.  [CF]  1657 Patient is awake, alert, oriented, and stable.  She continues to deny accidental or intentional overdose and she continues to, in my opinion, have the capacity to make her own decisions.  She does not meet IVC criteria.  She and her aunt are comfortable with the plan for discharge.  Her lactic acid after 2 L of fluid has come down to 1.  I am giving her follow-up information as well as a prescription for intranasal Narcan.  I am also providing intranasal  Narcan for her to take with her as per the new [facility] policy  [CF]  1658 END OF OBSERVATION STATUS: After an appropriate period of observation, this patient is being discharged due to the following reason(s):  no evidence of acute toxidrome, back to baseline mental status, improved labs    [CF]    Clinical Course User Index [CF] Loleta Rose, MD    ____________________________________________  FINAL CLINICAL IMPRESSION(S) / ED DIAGNOSES  Final diagnoses:  Altered mental status, unspecified altered mental status type  Opioid overdose, accidental or unintentional, initial encounter  Volume depletion  Cocaine abuse     MEDICATIONS GIVEN DURING THIS VISIT:  Medications  sodium chloride 0.9 % bolus 1,000 mL (0 mLs Intravenous Stopped 11/18/16 1510)  sodium chloride 0.9 % bolus 1,000 mL (0 mLs Intravenous Stopped 11/18/16 1616)  naloxone (NARCAN) nasal spray 4 mg/0.1 mL (1 spray Nasal Provided for home use 11/18/16 1750)     NEW OUTPATIENT MEDICATIONS STARTED DURING THIS VISIT:  Discharge Medication List as of 11/18/2016  5:06 PM    START taking these medications   Details  naloxone (NARCAN) nasal spray 4 mg/0.1 mL Administer 1 spray as needed into one nostril in the case of decreased breathing and responsiveness thought to be due to narcotics overdose.  Call 911 and repeat dose every 2 to 3 minutes in alternating nostrils until EMS arrives., Print        Discharge Medication List as of 11/18/2016  5:06 PM      Discharge Medication List as of 11/18/2016  5:06 PM       Note:  This document was prepared using Dragon voice recognition software  and may include unintentional dictation errors.    Loleta Rose, MD 11/18/16 [phone]

## 2016-11-18 NOTE — ED Notes (Signed)
Date and time results received: 11/18/16 12:29 PM  Test: Lactic Critical Value: 2.5  Name of Provider Notified: Dr. York Cerise  Orders Received? Or Actions Taken?: Critical Value Acknowledged

## 2016-11-18 NOTE — ED Triage Notes (Signed)
Pt presents to ED via [facility]. Per EMS pt was found unresponsive with pinpoint pupils. EMS reports 6mg  of Narcan given, 2mg  intranasally and 4mg  IV. Pt now c/o feeling cold and nauseated. Pt arrives to ED alert and oriented at this time.

## 2016-11-18 NOTE — ED Notes (Signed)
Gave pt sprite to drink after assisting pt to toilet to urinate.

## 2016-11-18 NOTE — ED Notes (Signed)
Jae Dire, RN to bedside to assist patient to the bathroom.

## 2016-11-18 NOTE — ED Notes (Signed)
Pt given meal tray and able to eat. Pt lying in bed at this time in NAD. Pt denies feeling lightheaded.

## 2016-11-18 NOTE — ED Notes (Signed)
NAD noted at this time. Pt resting in bed with family at bedside. Pt denies any needs at this time. Will continue to monitor for further patient needs.

## 2016-11-18 NOTE — ED Notes (Signed)
Pt ambulatory st time of discharge and in NAD. All questions answered and pt verbalized understanding of home Narcan.

## 2016-11-18 NOTE — Discharge Instructions (Signed)
As we discussed, your symptoms and workup today suggests that you accidentally took too many narcotics and it resulted in you nearly dying based on the way the paramedics found you.  We observed you for more than 6 hours and you are stable now.  Please avoid taking narcotic medications or any other drugs.  Follow-up with your regular doctor and/or with [facility] who can help with drug addiction issues.  We also recommend that you keep the nasal Narcan medication with you at all times in case this happens again and a family member or bystander needs to give you the medication.  I also provided a prescription for additional medication kits.    Return to the emergency department if you develop new or worsening symptoms that concern you.

## 2016-11-18 NOTE — ED Notes (Signed)
Pt attempted to give a urine sample, however missed the hat in the toilet. Will attempt to collect again in a little bit.

## 2016-11-18 NOTE — ED Notes (Signed)
Pt up to the bathroom with assistance from Amy, RN.

## 2022-08-24 ENCOUNTER — Emergency Department: Payer: Medicare Other

## 2022-08-24 ENCOUNTER — Other Ambulatory Visit: Payer: Self-pay

## 2022-08-24 ENCOUNTER — Inpatient Hospital Stay: Payer: Medicare Other

## 2022-08-24 ENCOUNTER — Inpatient Hospital Stay: Payer: Medicare Other | Admitting: Anesthesiology

## 2022-08-24 ENCOUNTER — Encounter
Admission: EM | Disposition: A | Discharge status: Home Health | Payer: Self-pay | Source: Home / Self Care | Attending: Internal Medicine

## 2022-08-24 ENCOUNTER — Inpatient Hospital Stay
Admission: EM | Admit: 2022-08-24 | Discharge: 2022-09-03 | DRG: 870 | Disposition: A | Discharge status: Home Health | Payer: Medicare Other | Attending: Internal Medicine | Admitting: Internal Medicine

## 2022-08-24 DIAGNOSIS — Z1629 Resistance to other single specified antibiotic: Secondary | ICD-10-CM | POA: Diagnosis present

## 2022-08-24 DIAGNOSIS — D6959 Other secondary thrombocytopenia: Secondary | ICD-10-CM | POA: Diagnosis present

## 2022-08-24 DIAGNOSIS — J384 Edema of larynx: Secondary | ICD-10-CM | POA: Diagnosis present

## 2022-08-24 DIAGNOSIS — R739 Hyperglycemia, unspecified: Secondary | ICD-10-CM | POA: Diagnosis not present

## 2022-08-24 DIAGNOSIS — J9601 Acute respiratory failure with hypoxia: Secondary | ICD-10-CM | POA: Diagnosis present

## 2022-08-24 DIAGNOSIS — Z79899 Other long term (current) drug therapy: Secondary | ICD-10-CM

## 2022-08-24 DIAGNOSIS — Z886 Allergy status to analgesic agent status: Secondary | ICD-10-CM | POA: Diagnosis not present

## 2022-08-24 DIAGNOSIS — A419 Sepsis, unspecified organism: Secondary | ICD-10-CM

## 2022-08-24 DIAGNOSIS — Z6837 Body mass index (BMI) 37.0-37.9, adult: Secondary | ICD-10-CM

## 2022-08-24 DIAGNOSIS — I10 Essential (primary) hypertension: Secondary | ICD-10-CM | POA: Diagnosis present

## 2022-08-24 DIAGNOSIS — E876 Hypokalemia: Secondary | ICD-10-CM | POA: Diagnosis present

## 2022-08-24 DIAGNOSIS — J9602 Acute respiratory failure with hypercapnia: Secondary | ICD-10-CM | POA: Diagnosis present

## 2022-08-24 DIAGNOSIS — I051 Rheumatic mitral insufficiency: Secondary | ICD-10-CM | POA: Diagnosis present

## 2022-08-24 DIAGNOSIS — A280 Pasteurellosis: Secondary | ICD-10-CM | POA: Diagnosis present

## 2022-08-24 DIAGNOSIS — K122 Cellulitis and abscess of mouth: Secondary | ICD-10-CM | POA: Diagnosis present

## 2022-08-24 DIAGNOSIS — Z1611 Resistance to penicillins: Secondary | ICD-10-CM | POA: Diagnosis present

## 2022-08-24 DIAGNOSIS — E871 Hypo-osmolality and hyponatremia: Secondary | ICD-10-CM | POA: Diagnosis present

## 2022-08-24 DIAGNOSIS — K7460 Unspecified cirrhosis of liver: Secondary | ICD-10-CM | POA: Diagnosis present

## 2022-08-24 DIAGNOSIS — R7881 Bacteremia: Secondary | ICD-10-CM

## 2022-08-24 DIAGNOSIS — R6521 Severe sepsis with septic shock: Secondary | ICD-10-CM | POA: Diagnosis present

## 2022-08-24 DIAGNOSIS — Z91030 Bee allergy status: Secondary | ICD-10-CM

## 2022-08-24 DIAGNOSIS — F17210 Nicotine dependence, cigarettes, uncomplicated: Secondary | ICD-10-CM | POA: Diagnosis present

## 2022-08-24 DIAGNOSIS — T380X5A Adverse effect of glucocorticoids and synthetic analogues, initial encounter: Secondary | ICD-10-CM | POA: Diagnosis not present

## 2022-08-24 DIAGNOSIS — B1920 Unspecified viral hepatitis C without hepatic coma: Secondary | ICD-10-CM | POA: Diagnosis present

## 2022-08-24 DIAGNOSIS — G9341 Metabolic encephalopathy: Secondary | ICD-10-CM | POA: Diagnosis not present

## 2022-08-24 DIAGNOSIS — J440 Chronic obstructive pulmonary disease with acute lower respiratory infection: Secondary | ICD-10-CM | POA: Diagnosis present

## 2022-08-24 DIAGNOSIS — A4150 Gram-negative sepsis, unspecified: Secondary | ICD-10-CM | POA: Diagnosis present

## 2022-08-24 DIAGNOSIS — Z1152 Encounter for screening for COVID-19: Secondary | ICD-10-CM

## 2022-08-24 HISTORY — PX: TRACHEOSTOMY TUBE PLACEMENT: SHX814

## 2022-08-24 LAB — CBC WITH DIFFERENTIAL/PLATELET
Abs Immature Granulocytes: 0.05 10*3/uL (ref 0.00–0.07)
Basophils Absolute: 0 10*3/uL (ref 0.0–0.1)
Basophils Relative: 0 %
Eosinophils Absolute: 0 10*3/uL (ref 0.0–0.5)
Eosinophils Relative: 0 %
HCT: 43.9 % (ref 36.0–46.0)
Hemoglobin: 15.6 g/dL — ABNORMAL HIGH (ref 12.0–15.0)
Immature Granulocytes: 0 %
Lymphocytes Relative: 6 %
Lymphs Abs: 0.7 10*3/uL (ref 0.7–4.0)
MCH: 32.4 pg (ref 26.0–34.0)
MCHC: 35.5 g/dL (ref 30.0–36.0)
MCV: 91.3 fL (ref 80.0–100.0)
Monocytes Absolute: 0.7 10*3/uL (ref 0.1–1.0)
Monocytes Relative: 6 %
Neutro Abs: 10.6 10*3/uL — ABNORMAL HIGH (ref 1.7–7.7)
Neutrophils Relative %: 88 %
Platelets: UNDETERMINED 10*3/uL (ref 150–400)
RBC: 4.81 MIL/uL (ref 3.87–5.11)
RDW: 13.7 % (ref 11.5–15.5)
Smear Review: UNDETERMINED
WBC: 12.1 10*3/uL — ABNORMAL HIGH (ref 4.0–10.5)
nRBC: 0 % (ref 0.0–0.2)

## 2022-08-24 LAB — BLOOD GAS, ARTERIAL
Acid-base deficit: 2.5 mmol/L — ABNORMAL HIGH (ref 0.0–2.0)
Bicarbonate: 22.5 mmol/L (ref 20.0–28.0)
FIO2: 40 %
MECHVT: 450 mL
Mechanical Rate: 16
O2 Saturation: 99.1 %
PEEP: 5 cmH2O
Patient temperature: 37
pCO2 arterial: 39 mmHg (ref 32–48)
pH, Arterial: 7.37 (ref 7.35–7.45)
pO2, Arterial: 107 mmHg (ref 83–108)

## 2022-08-24 LAB — URINALYSIS, COMPLETE (UACMP) WITH MICROSCOPIC
Bacteria, UA: NONE SEEN
Bilirubin Urine: NEGATIVE
Glucose, UA: NEGATIVE mg/dL
Ketones, ur: NEGATIVE mg/dL
Leukocytes,Ua: NEGATIVE
Nitrite: NEGATIVE
Protein, ur: 100 mg/dL — AB
Specific Gravity, Urine: 1.023 (ref 1.005–1.030)
pH: 6 (ref 5.0–8.0)

## 2022-08-24 LAB — CBC
HCT: 45.9 % (ref 36.0–46.0)
Hemoglobin: 16.4 g/dL — ABNORMAL HIGH (ref 12.0–15.0)
MCH: 32.5 pg (ref 26.0–34.0)
MCHC: 35.7 g/dL (ref 30.0–36.0)
MCV: 90.9 fL (ref 80.0–100.0)
Platelets: 98 10*3/uL — ABNORMAL LOW (ref 150–400)
RBC: 5.05 MIL/uL (ref 3.87–5.11)
RDW: 13.4 % (ref 11.5–15.5)
WBC: 13.2 10*3/uL — ABNORMAL HIGH (ref 4.0–10.5)
nRBC: 0 % (ref 0.0–0.2)

## 2022-08-24 LAB — COMPREHENSIVE METABOLIC PANEL
ALT: 21 U/L (ref 0–44)
AST: 31 U/L (ref 15–41)
Albumin: 3.9 g/dL (ref 3.5–5.0)
Alkaline Phosphatase: 67 U/L (ref 38–126)
Anion gap: 9 (ref 5–15)
BUN: 11 mg/dL (ref 8–23)
CO2: 22 mmol/L (ref 22–32)
Calcium: 8.8 mg/dL — ABNORMAL LOW (ref 8.9–10.3)
Chloride: 99 mmol/L (ref 98–111)
Creatinine, Ser: 0.78 mg/dL (ref 0.44–1.00)
GFR, Estimated: >60 mL/min (ref >60)
Glucose, Bld: 127 mg/dL — ABNORMAL HIGH (ref 70–99)
Potassium: 3.4 mmol/L — ABNORMAL LOW (ref 3.5–5.1)
Sodium: 130 mmol/L — ABNORMAL LOW (ref 135–145)
Total Bilirubin: 2.3 mg/dL — ABNORMAL HIGH (ref 0.3–1.2)
Total Protein: 9 g/dL — ABNORMAL HIGH (ref 6.5–8.1)

## 2022-08-24 LAB — LACTIC ACID, PLASMA: Lactic Acid, Venous: 1.4 mmol/L (ref 0.5–1.9)

## 2022-08-24 LAB — GLUCOSE, CAPILLARY
Glucose-Capillary: 132 mg/dL — ABNORMAL HIGH (ref 70–99)
Glucose-Capillary: 133 mg/dL — ABNORMAL HIGH (ref 70–99)
Glucose-Capillary: 131 mg/dL — ABNORMAL HIGH (ref 70–99)
Glucose-Capillary: 141 mg/dL — ABNORMAL HIGH (ref 70–99)

## 2022-08-24 LAB — PROCALCITONIN: Procalcitonin: 0.53 ng/mL

## 2022-08-24 LAB — APTT: aPTT: 32 seconds (ref 24–36)

## 2022-08-24 LAB — PROTIME-INR
INR: 1.1 (ref 0.8–1.2)
Prothrombin Time: 14.8 seconds (ref 11.4–15.2)

## 2022-08-24 LAB — RESP PANEL BY RT-PCR (RSV, FLU A&B, COVID)  RVPGX2
Influenza A by PCR: NEGATIVE
Influenza B by PCR: NEGATIVE
Resp Syncytial Virus by PCR: NEGATIVE
SARS Coronavirus 2 by RT PCR: NEGATIVE

## 2022-08-24 LAB — GROUP A STREP BY PCR: Group A Strep by PCR: NOT DETECTED

## 2022-08-24 LAB — CULTURE, BLOOD (ROUTINE X 2)

## 2022-08-24 LAB — MRSA NEXT GEN BY PCR, NASAL: MRSA by PCR Next Gen: NOT DETECTED

## 2022-08-24 SURGERY — CREATION, TRACHEOSTOMY
Anesthesia: Monitor Anesthesia Care

## 2022-08-24 MED ORDER — FAMOTIDINE IN NACL 20-0.9 MG/50ML-% IV SOLN
20.0000 mg | Freq: Two times a day (BID) | INTRAVENOUS | Status: DC
  Administered 2022-08-24 – 2022-09-01 (×18): 20 mg via INTRAVENOUS
  Filled 2022-08-24 (×18): qty 50

## 2022-08-24 MED ORDER — DEXAMETHASONE SODIUM PHOSPHATE 10 MG/ML IJ SOLN
10.0000 mg | Freq: Three times a day (TID) | INTRAMUSCULAR | Status: DC
  Filled 2022-08-24: qty 1

## 2022-08-24 MED ORDER — DOCUSATE SODIUM 100 MG PO CAPS: 100.0000 mg | ORAL_CAPSULE | Freq: Two times a day (BID) | ORAL | Status: DC | PRN

## 2022-08-24 MED ORDER — MIDAZOLAM HCL 2 MG/2ML IJ SOLN
INTRAMUSCULAR | Status: DC | PRN
  Administered 2022-08-24: 2 mg via INTRAVENOUS

## 2022-08-24 MED ORDER — POLYETHYLENE GLYCOL 3350 17 G PO PACK
17.0000 g | PACK | Freq: Every day | ORAL | Status: DC
  Administered 2022-08-25 – 2022-08-30 (×6): 17 g
  Filled 2022-08-24 (×6): qty 1

## 2022-08-24 MED ORDER — PROPOFOL 10 MG/ML IV BOLUS
INTRAVENOUS | Status: DC | PRN
  Administered 2022-08-24: 120 mg via INTRAVENOUS

## 2022-08-24 MED ORDER — REMIFENTANIL HCL 1 MG IV SOLR
INTRAVENOUS | Status: AC
  Filled 2022-08-24: qty 1000

## 2022-08-24 MED ORDER — DIPHENHYDRAMINE HCL 50 MG/ML IJ SOLN
25.0000 mg | Freq: Three times a day (TID) | INTRAMUSCULAR | Status: AC
  Administered 2022-08-24 – 2022-08-25 (×4): 25 mg via INTRAVENOUS
  Filled 2022-08-24 (×4): qty 1

## 2022-08-24 MED ORDER — FENTANYL BOLUS VIA INFUSION
50.0000 ug | INTRAVENOUS | Status: DC | PRN
  Administered 2022-08-24 – 2022-08-26 (×4): 50 ug via INTRAVENOUS
  Administered 2022-08-26 (×2): 75 ug via INTRAVENOUS
  Administered 2022-08-26: 50 ug via INTRAVENOUS
  Administered 2022-08-27 – 2022-08-28 (×6): 100 ug via INTRAVENOUS
  Administered 2022-08-28: 75 ug via INTRAVENOUS
  Administered 2022-08-29 – 2022-09-01 (×12): 100 ug via INTRAVENOUS

## 2022-08-24 MED ORDER — SODIUM CHLORIDE 0.9 % IV SOLN: INTRAVENOUS | Status: DC | PRN

## 2022-08-24 MED ORDER — SODIUM CHLORIDE 0.9 % IV SOLN
250.0000 mL | INTRAVENOUS | Status: DC
  Administered 2022-09-01: 250 mL via INTRAVENOUS

## 2022-08-24 MED ORDER — ENOXAPARIN SODIUM 40 MG/0.4ML IJ SOSY
40.0000 mg | PREFILLED_SYRINGE | INTRAMUSCULAR | Status: DC
  Administered 2022-08-24 – 2022-09-02 (×10): 40 mg via SUBCUTANEOUS
  Filled 2022-08-24 (×10): qty 0.4

## 2022-08-24 MED ORDER — POLYETHYLENE GLYCOL 3350 17 G PO PACK: 17.0000 g | PACK | Freq: Every day | ORAL | Status: DC | PRN

## 2022-08-24 MED ORDER — DEXAMETHASONE SODIUM PHOSPHATE 4 MG/ML IJ SOLN
INTRAMUSCULAR | Status: AC
  Filled 2022-08-24: qty 1

## 2022-08-24 MED ORDER — POTASSIUM CHLORIDE CRYS ER 20 MEQ PO TBCR: 40.0000 meq | EXTENDED_RELEASE_TABLET | Freq: Once | ORAL | Status: DC

## 2022-08-24 MED ORDER — CHLORHEXIDINE GLUCONATE CLOTH 2 % EX PADS
6.0000 | MEDICATED_PAD | Freq: Every day | CUTANEOUS | Status: DC
  Administered 2022-08-24: 6 via TOPICAL

## 2022-08-24 MED ORDER — ACETAMINOPHEN 325 MG PO TABS: 650.0000 mg | ORAL_TABLET | ORAL | Status: DC | PRN

## 2022-08-24 MED ORDER — DEXAMETHASONE SODIUM PHOSPHATE 10 MG/ML IJ SOLN
10.0000 mg | Freq: Three times a day (TID) | INTRAMUSCULAR | Status: DC
  Administered 2022-08-24 – 2022-08-26 (×6): 10 mg via INTRAVENOUS
  Filled 2022-08-24 (×7): qty 1

## 2022-08-24 MED ORDER — IOHEXOL 300 MG/ML  SOLN
75.0000 mL | Freq: Once | INTRAMUSCULAR | Status: AC | PRN
  Administered 2022-08-24: 50 mL via INTRAVENOUS

## 2022-08-24 MED ORDER — INSULIN ASPART 100 UNIT/ML IJ SOLN
0.0000 [IU] | INTRAMUSCULAR | Status: DC
  Administered 2022-08-24 – 2022-09-02 (×23): 2 [IU] via SUBCUTANEOUS
  Filled 2022-08-24 (×24): qty 1

## 2022-08-24 MED ORDER — MIDAZOLAM HCL 2 MG/2ML IJ SOLN
INTRAMUSCULAR | Status: AC
  Filled 2022-08-24: qty 2

## 2022-08-24 MED ORDER — NOREPINEPHRINE 4 MG/250ML-% IV SOLN
2.0000 ug/min | INTRAVENOUS | Status: DC
  Administered 2022-08-24: 2 ug/min via INTRAVENOUS
  Filled 2022-08-24: qty 250

## 2022-08-24 MED ORDER — DOCUSATE SODIUM 50 MG/5ML PO LIQD
100.0000 mg | Freq: Two times a day (BID) | ORAL | Status: DC
  Administered 2022-08-24 – 2022-08-31 (×13): 100 mg
  Filled 2022-08-24 (×13): qty 10

## 2022-08-24 MED ORDER — POTASSIUM CHLORIDE 10 MEQ/100ML IV SOLN
10.0000 meq | INTRAVENOUS | Status: AC
  Administered 2022-08-24 (×2): 10 meq via INTRAVENOUS
  Filled 2022-08-24 (×2): qty 100

## 2022-08-24 MED ORDER — LACTATED RINGERS IV BOLUS (SEPSIS)
1000.0000 mL | Freq: Once | INTRAVENOUS | Status: AC
  Administered 2022-08-24: 1000 mL via INTRAVENOUS

## 2022-08-24 MED ORDER — SODIUM CHLORIDE 0.9 % IV SOLN
3.0000 g | Freq: Once | INTRAVENOUS | Status: AC
  Administered 2022-08-24: 3 g via INTRAVENOUS
  Filled 2022-08-24: qty 8

## 2022-08-24 MED ORDER — DEXAMETHASONE SODIUM PHOSPHATE 4 MG/ML IJ SOLN: 4.0000 mg | Freq: Four times a day (QID) | INTRAMUSCULAR | Status: DC

## 2022-08-24 MED ORDER — SODIUM CHLORIDE 0.9 % IV SOLN
3.0000 g | Freq: Four times a day (QID) | INTRAVENOUS | Status: DC
  Administered 2022-08-24 – 2022-08-25 (×3): 3 g via INTRAVENOUS
  Filled 2022-08-24 (×4): qty 8

## 2022-08-24 MED ORDER — PROPOFOL 10 MG/ML IV BOLUS
INTRAVENOUS | Status: AC
  Filled 2022-08-24: qty 20

## 2022-08-24 MED ORDER — LACTATED RINGERS IV SOLN: INTRAVENOUS | Status: DC

## 2022-08-24 MED ORDER — PROPOFOL 1000 MG/100ML IV EMUL
0.0000 ug/kg/min | INTRAVENOUS | Status: DC
  Administered 2022-08-24: 5 ug/kg/min via INTRAVENOUS
  Administered 2022-08-24 – 2022-08-25 (×2): 30 ug/kg/min via INTRAVENOUS
  Administered 2022-08-25: 15 ug/kg/min via INTRAVENOUS
  Administered 2022-08-26 (×3): 20 ug/kg/min via INTRAVENOUS
  Administered 2022-08-27: 25 ug/kg/min via INTRAVENOUS
  Administered 2022-08-27: 20 ug/kg/min via INTRAVENOUS
  Administered 2022-08-27: 15 ug/kg/min via INTRAVENOUS
  Administered 2022-08-28 (×3): 25 ug/kg/min via INTRAVENOUS
  Administered 2022-08-29: 35 ug/kg/min via INTRAVENOUS
  Administered 2022-08-29: 25 ug/kg/min via INTRAVENOUS
  Administered 2022-08-29 (×2): 30 ug/kg/min via INTRAVENOUS
  Administered 2022-08-30 – 2022-09-01 (×9): 35 ug/kg/min via INTRAVENOUS
  Filled 2022-08-24 (×26): qty 100

## 2022-08-24 MED ORDER — IPRATROPIUM-ALBUTEROL 0.5-2.5 (3) MG/3ML IN SOLN
3.0000 mL | RESPIRATORY_TRACT | Status: DC | PRN
  Administered 2022-08-26 – 2022-08-27 (×2): 3 mL via RESPIRATORY_TRACT
  Filled 2022-08-24 (×3): qty 3

## 2022-08-24 MED ORDER — FENTANYL 2500MCG IN NS 250ML (10MCG/ML) PREMIX INFUSION
50.0000 ug/h | INTRAVENOUS | Status: DC
  Administered 2022-08-24: 50 ug/h via INTRAVENOUS
  Administered 2022-08-25: 125 ug/h via INTRAVENOUS
  Administered 2022-08-26: 100 ug/h via INTRAVENOUS
  Administered 2022-08-27: 75 ug/h via INTRAVENOUS
  Administered 2022-08-27 – 2022-08-29 (×3): 100 ug/h via INTRAVENOUS
  Administered 2022-08-30: 125 ug/h via INTRAVENOUS
  Administered 2022-08-31 (×2): 150 ug/h via INTRAVENOUS
  Filled 2022-08-24 (×9): qty 250

## 2022-08-24 MED ORDER — MIDAZOLAM HCL 2 MG/2ML IJ SOLN
1.0000 mg | INTRAMUSCULAR | Status: DC | PRN
  Administered 2022-08-24 (×2): 2 mg via INTRAVENOUS
  Filled 2022-08-24 (×2): qty 2

## 2022-08-24 MED ORDER — LACTATED RINGERS IV BOLUS
1000.0000 mL | Freq: Once | INTRAVENOUS | Status: AC
  Administered 2022-08-24: 1000 mL via INTRAVENOUS

## 2022-08-24 MED ORDER — REMIFENTANIL HCL 1 MG IV SOLR
INTRAVENOUS | Status: DC | PRN
  Administered 2022-08-24 (×2): 50 ug via INTRAVENOUS

## 2022-08-24 MED ORDER — DEXAMETHASONE SODIUM PHOSPHATE 10 MG/ML IJ SOLN
10.0000 mg | Freq: Once | INTRAMUSCULAR | Status: AC
  Administered 2022-08-24: 10 mg via INTRAVENOUS
  Filled 2022-08-24: qty 1

## 2022-08-24 MED ORDER — ROCURONIUM BROMIDE 100 MG/10ML IV SOLN
INTRAVENOUS | Status: DC | PRN
  Administered 2022-08-24: 50 mg via INTRAVENOUS

## 2022-08-24 MED ORDER — LACTATED RINGERS IV SOLN: INTRAVENOUS | Status: DC | PRN

## 2022-08-24 MED ORDER — LACTATED RINGERS IV BOLUS
500.0000 mL | Freq: Once | INTRAVENOUS | Status: AC
  Administered 2022-08-24: 500 mL via INTRAVENOUS

## 2022-08-24 SURGICAL SUPPLY — 30 items
BLADE SURG 15 STRL LF DISP TIS (BLADE) ×1 IMPLANT
BLADE SURG 15 STRL SS (BLADE)
CORD BIP STRL DISP 12FT (MISCELLANEOUS) IMPLANT
ELECT CAUTERY BLADE TIP 2.5 (TIP)
ELECTRODE CAUTERY BLDE TIP 2.5 (TIP) ×1 IMPLANT
FORCEPS JEWEL BIP 4-3/4 STR (INSTRUMENTS) IMPLANT
GAUZE 4X4 16PLY ~~LOC~~+RFID DBL (SPONGE) ×1 IMPLANT
GLOVE BIO SURGEON STRL SZ7.5 (GLOVE) ×1 IMPLANT
GOWN STRL REUS W/ TWL LRG LVL3 (GOWN DISPOSABLE) ×3 IMPLANT
GOWN STRL REUS W/TWL LRG LVL3 (GOWN DISPOSABLE)
HLDR TRACH TUBE NECKBAND 18 (MISCELLANEOUS) ×1 IMPLANT
MANIFOLD NEPTUNE II (INSTRUMENTS) ×1 IMPLANT
NS IRRIG 500ML POUR BTL (IV SOLUTION) ×1 IMPLANT
PACK HEAD/NECK (MISCELLANEOUS) ×1 IMPLANT
SCOPE INTUBATION FLEX  5.3X65 (INSTRUMENTS) ×1
SCOPE INTUBATION FLEX 5.3X65 (INSTRUMENTS) IMPLANT
SHEARS HARMONIC 9CM CVD (BLADE) ×1 IMPLANT
SOL PREP POV-IOD 4OZ 10% (MISCELLANEOUS) ×1 IMPLANT
SPONGE DRAIN TRACH 4X4 STRL 2S (GAUZE/BANDAGES/DRESSINGS) ×1 IMPLANT
SPONGE KITTNER 5P (MISCELLANEOUS) ×1 IMPLANT
SPONGE VERSALON 4X4 4PLY (MISCELLANEOUS) ×1 IMPLANT
SUCTION FRAZIER HANDLE 10FR (MISCELLANEOUS)
SUCTION TUBE FRAZIER 10FR DISP (MISCELLANEOUS) ×1 IMPLANT
SUT ETHILON 2 0 FS 18 (SUTURE) ×1 IMPLANT
SUT SILK 2 0 SH (SUTURE) ×1 IMPLANT
SUT VIC AB 3-0 PS2 18 (SUTURE) ×1 IMPLANT
TRAP FLUID SMOKE EVACUATOR (MISCELLANEOUS) ×1 IMPLANT
TUBE TRACH  6.0 CUFF FLEX (MISCELLANEOUS)
TUBE TRACH 6.0 CUFF FLEX (MISCELLANEOUS) IMPLANT
storz endoscope IMPLANT

## 2022-08-24 NOTE — Consult Note (Signed)
Pharmacy Antibiotic Note  Sierra Patterson is a 61 y.o. female admitted on 08/24/2022 with  sumnadibular/ENT infection .  Pharmacy has been consulted for Unasyn dosing. Tmax 100.1, WBC 12.1,   CT Neck: Supraglottic laryngeal edema worse towards the right with airway narrowing. There is also notable right submandibular space and gland inflammation, the primary infectious/inflammatory cause is uncertain.  Plan: Start Unasyn 3 g q6H.   Height: 5\' 2"  (157.5 cm) Weight: 79.6 kg (175 lb 7.8 oz) IBW/kg (Calculated) : 50.1  Temp (24hrs), Avg:100.1 F (37.8 C), Min:100.1 F (37.8 C), Max:100.1 F (37.8 C)  Recent Labs  Lab 08/24/22 0810  WBC 12.1*  CREATININE 0.78  LATICACIDVEN 1.4    Estimated Creatinine Clearance: 72.2 mL/min (by C-G formula based on SCr of 0.78 mg/dL).    Allergies  Allergen Reactions   Bee Venom Anaphylaxis and Swelling   Ibuprofen     REACTION: sick on stomach    Antimicrobials this admission: 5/27 Unaysn >>    Dose adjustments this admission: None  Microbiology results: 5/27 BCx: pending   Thank you for allowing pharmacy to be a part of this patient's care.  Ronnald Ramp, PharmD, BCPS 08/24/2022 10:30 AM

## 2022-08-24 NOTE — Sepsis Progress Note (Signed)
Elink monitoring for the code sepsis protocol.  

## 2022-08-24 NOTE — ED Provider Notes (Signed)
[facility] Provider Note    Event Date/Time   First MD Initiated Contact with Patient 08/24/22 [phone]     (approximate)   History   Facial Swelling   HPI  Sierra Patterson is a 62 y.o. female with limited records in our system presenting to the emergency department for evaluation of facial swelling.  Patient was outside working in her yard yesterday.  She did not notice any bug bites, but yesterday evening she began to notice some swelling over the right side of her face with some neck soreness.  This got worse this morning leading her to present to the ER.  Reports that it is now difficult to swallow and her throat feels swollen.  Denies history of similar.  Reports that she has previously been on blood pressure medicine, but has not taken this recently.  No fevers, rash, vomiting, abdominal pain, diarrhea.      Physical Exam   Triage Vital Signs: ED Triage Vitals  Enc Vitals Group     BP 08/24/22 0800 (!) 192/98     Pulse Rate 08/24/22 0757 (!) 107     Resp 08/24/22 0757 (!) 24     Temp 08/24/22 0757 100.1 F (37.8 C)     Temp Source 08/24/22 0757 Oral     SpO2 08/24/22 0757 95 %     Weight 08/24/22 0759 175 lb 7.8 oz (79.6 kg)     Height 08/24/22 0759 5\' 2"  (1.575 m)     Head Circumference --      Peak Flow --      Pain Score 08/24/22 0758 8     Pain Loc --      Pain Edu? --      Excl. in GC? --     Most recent vital signs: Vitals:   08/24/22 1445 08/24/22 1500  BP: 123/86   Pulse: 93 87  Resp: 16 16  Temp:  97.7 F (36.5 C)  SpO2: 100% 99%     General: Awake, interactive  HEENT: There is swelling primarily along the right lower face into the submandibular area with associated tenderness to palpation.  There is no significant lip or perioral swelling.  Patient is edentulous.  There does appear to be some uvular swelling as well as some asymmetric edema on the right side of the posterior oropharynx.  There is no stridor or wheezing, but  patient's voice is hoarse and somewhat muffled.  She is managing her secretions without issue. CV:  Mild tachycardia with regular rhythm, normal peripheral perfusion Resp:  Lungs clear, mildly labored respirations Abd:  Soft, nondistended.  Neuro:  Symmetric facial movement, fluid speech   ED Results / Procedures / Treatments   Labs (all labs ordered are listed, but only abnormal results are displayed) Labs Reviewed  CBC WITH DIFFERENTIAL/PLATELET - Abnormal; Notable for the following components:      Result Value   WBC 12.1 (*)    Hemoglobin 15.6 (*)    Neutro Abs 10.6 (*)    All other components within normal limits  COMPREHENSIVE METABOLIC PANEL - Abnormal; Notable for the following components:   Sodium 130 (*)    Potassium 3.4 (*)    Glucose, Bld 127 (*)    Calcium 8.8 (*)    Total Protein 9.0 (*)    Total Bilirubin 2.3 (*)    All other components within normal limits  CBC - Abnormal; Notable for the following components:   WBC 13.2 (*)  Hemoglobin 16.4 (*)    Platelets 98 (*)    All other components within normal limits  URINALYSIS, COMPLETE (UACMP) WITH MICROSCOPIC - Abnormal; Notable for the following components:   Color, Urine YELLOW (*)    APPearance CLEAR (*)    Hgb urine dipstick SMALL (*)    Protein, ur 100 (*)    All other components within normal limits  BLOOD GAS, ARTERIAL - Abnormal; Notable for the following components:   Acid-base deficit 2.5 (*)    All other components within normal limits  GLUCOSE, CAPILLARY - Abnormal; Notable for the following components:   Glucose-Capillary 132 (*)    All other components within normal limits  RESP PANEL BY RT-PCR (RSV, FLU A&B, COVID)  RVPGX2  MRSA NEXT GEN BY PCR, NASAL  CULTURE, BLOOD (ROUTINE X 2)  CULTURE, BLOOD (ROUTINE X 2)  GROUP A STREP BY PCR  RESPIRATORY PANEL BY PCR  LACTIC ACID, PLASMA  PROTIME-INR  APTT  PROCALCITONIN  HIV ANTIBODY (ROUTINE TESTING W REFLEX)  STREP PNEUMONIAE URINARY ANTIGEN   C-REACTIVE PROTEIN  C4 COMPLEMENT     EKG EKG independently reviewed interpreted by myself (ER attending) demonstrates:    RADIOLOGY Imaging independently reviewed and interpreted by myself demonstrates:  CT scan demonstrates asymmetric laryngeal edema with airway narrowing without focal abscess  PROCEDURES:  Critical Care performed: Yes, see critical care procedure note(s)  CRITICAL CARE Performed by: Trinna Post   Total critical care time: 30 minutes  Critical care time was exclusive of separately billable procedures and treating other patients.  Critical care was necessary to treat or prevent imminent or life-threatening deterioration.  Critical care was time spent personally by me on the following activities: development of treatment plan with patient and/or surrogate as well as nursing, discussions with consultants, evaluation of patient's response to treatment, examination of patient, obtaining history from patient or surrogate, ordering and performing treatments and interventions, ordering and review of laboratory studies, ordering and review of radiographic studies, pulse oximetry and re-evaluation of patient's condition.    MEDICATIONS ORDERED IN ED: Medications  docusate sodium (COLACE) capsule 100 mg ( Oral MAR Unhold 08/24/22 1058)  polyethylene glycol (MIRALAX / GLYCOLAX) packet 17 g ( Oral MAR Unhold 08/24/22 1058)  enoxaparin (LOVENOX) injection 40 mg ( Subcutaneous MAR Unhold 08/24/22 1058)  acetaminophen (TYLENOL) tablet 650 mg ( Oral MAR Unhold 08/24/22 1058)  famotidine (PEPCID) IVPB 20 mg premix (0 mg Intravenous Stopped 08/24/22 1210)  diphenhydrAMINE (BENADRYL) injection 25 mg (25 mg Intravenous Given 08/24/22 1333)  lactated ringers infusion ( Intravenous Infusion Verify 08/24/22 1500)  Ampicillin-Sulbactam (UNASYN) 3 g in sodium chloride 0.9 % 100 mL IVPB ( Intravenous MAR Unhold 08/24/22 1058)  docusate (COLACE) 50 MG/5ML liquid 100 mg (100 mg Per Tube Not  Given 08/24/22 1140)  polyethylene glycol (MIRALAX / GLYCOLAX) packet 17 g (17 g Per Tube Not Given 08/24/22 1141)  fentaNYL in NS (83mcg/ml) infusion-PREMIX (100 mcg/hr Intravenous Infusion Verify 08/24/22 1500)  fentaNYL (SUBLIMAZE) bolus via infusion 50-100 mcg (50 mcg Intravenous Bolus from Bag 08/24/22 1438)  propofol (DIPRIVAN) 1000 MG/100ML infusion (25 mcg/kg/min  79.6 kg Intravenous Infusion Verify 08/24/22 1500)  midazolam (VERSED) injection 1-2 mg (2 mg Intravenous Given 08/24/22 1121)  ipratropium-albuterol (DUONEB) 0.5-2.5 (3) MG/3ML nebulizer solution 3 mL (has no administration in time range)  0.9 %  sodium chloride infusion ( Intravenous Infusion Verify 08/24/22 1500)  dexamethasone (DECADRON) injection 10 mg (has no administration in time range)  insulin aspart (novoLOG)  injection 0-15 Units (has no administration in time range)  dexamethasone (DECADRON) injection 10 mg (10 mg Intravenous Given 08/24/22 0826)  iohexol (OMNIPAQUE) 300 MG/ML solution 75 mL (50 mLs Intravenous Contrast Given 08/24/22 0829)  lactated ringers bolus 1,000 mL (0 mLs Intravenous Stopped 08/24/22 1030)  Ampicillin-Sulbactam (UNASYN) 3 g in sodium chloride 0.9 % 100 mL IVPB (0 g Intravenous Stopped 08/24/22 1017)  potassium chloride 10 mEq in 100 mL IVPB (0 mEq Intravenous Stopped 08/24/22 1435)  midazolam (VERSED) 2 MG/2ML injection (  Duplicate 08/24/22 1124)  dexamethasone (DECADRON) 4 MG/ML injection (  Duplicate 08/24/22 1526)     IMPRESSION / MDM / ASSESSMENT AND PLAN / ED COURSE  I reviewed the triage vital signs and the nursing notes.  Differential diagnosis includes, but is not limited to, Ludwig angina, submandibular abscess, peritonsillar abscess, lower suspicion for anaphylaxis given location of swelling, absence of hives, GI symptoms, hypotension  Patient's presentation is most consistent with acute presentation with potential threat to life or bodily function.  62 year old female  presenting to the emergency department for evaluation of facial swelling.  She presents with asymmetric facial swelling with hoarse voice, but fortunately managing her secretions with normal oxygen saturation on room air.  Do think she can safely proceed to CT scan, but did request scan prior to lab work given concern for development of airway compromise. I have ordered 10 mg of Decadron.    Her CT scan does demonstrate laryngeal edema as well as swelling of the right submandibular space without associated fluid collection. Case was reviewed with Dr. Jenne Campus with ENT.  He recommended 3 g of Unasyn and he will come evaluate patient.  Does have tachycardia and leukocytosis, sepsis orders initiated. Clinical Course as of 08/24/22 1542  Mon Aug 24, 2022  1009 Case discussed with Dr. Jenne Campus who evaluated patient at bedside.  He does note subglottic swelling and plans take the patient to the operating room for intubation.  Did request that I reach out to the medicine team for admission following this. As the patient will be intubated, will reach out to ICU team. [NR]  1015 Discussed with ICU team.  They will evaluate the patient for anticipated admission following surgical intervention by ENT. [NR]    Clinical Course User Index [NR] Trinna Post, MD     FINAL CLINICAL IMPRESSION(S) / ED DIAGNOSES   Final diagnoses:  Laryngeal edema     Rx / DC Orders   ED Discharge Orders     None        Note:  This document was prepared using Dragon voice recognition software and may include unintentional dictation errors.   Trinna Post, MD 08/24/22 [phone]

## 2022-08-24 NOTE — Anesthesia Procedure Notes (Signed)
Procedure Name: Awake intubation Date/Time: 08/24/2022 10:44 AM  Performed by: Katherine Basset, CRNAPre-anesthesia Checklist: Patient identified, Emergency Drugs available, Suction available and Patient being monitored Patient Re-evaluated:Patient Re-evaluated prior to induction Oxygen Delivery Method: Circle system utilized Preoxygenation: Pre-oxygenation with 100% oxygen Induction Type: IV induction and Rapid sequence Grade View: Grade II Nasal Tubes: Nasal Rae Tube size: 6.5 mm Number of attempts: 1 Airway Equipment and Method: Oral airway, Fiberoptic brochoscope and Patient positioned with wedge pillow Placement Confirmation: ETT inserted through vocal cords under direct vision, positive ETCO2 and breath sounds checked- equal and bilateral Secured at: 32 cm Tube secured with: Tape Dental Injury: Teeth and Oropharynx as per pre-operative assessment  Difficulty Due To: Difficulty was anticipated, Difficult Airway- due to large tongue, Difficult Airway-  due to edematous airway and Difficult Airway- due to limited oral opening Comments: Pt was intubated by Dr. Jenne Campus, IV Remi was given, 7.0 NPA w/Lido was passed while Pt spontaneously breathing, NPA removed and Dr. Jenne Campus passed fiberoptic scope through Right nasal passage through cords into bronchus, Nasal RAE ETT passed down w/o complication, breath sounds bilateral, Pt tolerated throughout, VSS

## 2022-08-24 NOTE — Op Note (Signed)
08/24/2022  10:58 AM    Eldred Manges  [id number]   Pre-Op Dx: airway obstruction  Post-op Dx: SAME  Proc: Nasotracheal intubation via fiberoptic bronchoscope  Surg:  Sierra Patterson  Anes:  GOT  EBL: 0  Comp: None  Findings: Nasal septal perforation, significant palatal and uvular edema, significant supraglottic and hypopharyngeal edema worse on the right than left, significant epiglottic edema, normal vocal folds, normal subglottis and trachea down to the carina.  Procedure: [name] was taken emergently from the emergency room to the operating room.  A topical anesthetic of viscous lidocaine was placed on a nasal trumpet introduced in the right nostril.  This was left approximately 45 minutes.  With the patient at approximately 45 degrees flexible fiberoptic bronchoscope with a 6.5 endotracheal tube on the outer surface was placed through the right nostril.  There was a large septal perforation which had previously been identified.  Proceeding posteriorly there was significant edema of the soft palate and uvula.  Proceeding inferiorly the tongue base and epiglottis were identified there was significant edema of the supraglottis and the epiglottis.  Maneuvering around the epiglottis into the larynx there was normal apparent vocal folds and normal vocal cord mobility.  The bronchoscope was then placed through the larynx into the subglottis and the bronchoscope was advanced down to the carina.  The endotracheal tube was then slid over the bronchoscope in a Seldinger fashion.  The flexible scope was removed.  The endotracheal tube was secured.  There were excellent breath sounds bilaterally and normal CO2 recovery.  With the airway secured the patient was then returned to anesthesia where she was sedated and transported to the intensive care unit in stable condition.  Dispo:   Good  Plan: I have spoken with Dr. Belia Heman in the ICU, would recommend continue IV antibiotics with Unasyn 3 g IV  every 6 and Decadron 10 mg IV every 8 hours.  Continue this for at least 48 hours.  Possibly repeat CT scan at that point.  If cuff is deflated and there is a large leak and the anterior neck swelling has decreased as has the floor mouth and uvular swelling then attempts at extubation would be possible.  I also spoken with him about the possibility of doing a pharyngeal culture which can be done in the ICU.  I will continue to follow with the patient as an inpatient.  Sierra Patterson  08/24/2022 10:58 AM

## 2022-08-24 NOTE — Transfer of Care (Signed)
Immediate Anesthesia Transfer of Care Note  Patient: Sierra Patterson  Procedure(s) Performed: TRACHEOSTOMY  Patient Location: ICU  Anesthesia Type:MAC and General  Level of Consciousness: sedated and Patient remains intubated per anesthesia plan  Airway & Oxygen Therapy: Patient remains intubated per anesthesia plan  Post-op Assessment: Report given to RN and Post -op Vital signs reviewed and stable  Post vital signs: Reviewed and stable  Last Vitals:  Vitals Value Taken Time  BP 254/155 08/24/22 1116  Temp    Pulse 140 08/24/22 1118  Resp 18 08/24/22 1118  SpO2 94 % 08/24/22 1118  Vitals shown include unvalidated device data.  Last Pain:  Vitals:   08/24/22 0813  TempSrc:   PainSc: 8          Complications:  Encounter Notable Events  Notable Event Outcome Phase Comment  Difficult to intubate - expected  Intraprocedure Filed from anesthesia note documentation.

## 2022-08-24 NOTE — H&P (Signed)
NAME:  Sierra Patterson, MRN:  [id number], DOB:  [birth year], LOS: 0 ADMISSION DATE:  08/24/2022, CONSULTATION DATE:  08/24/2022 REFERRING MD:  Dr. Rosalia Hammers, CHIEF COMPLAINT:  Facial swelling   Brief Pt Description / Synopsis:  62 y.o. 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  History of Present Illness:  Sierra Patterson is a 62 year old female with a past medical history significant for COPD, hypertension, cirrhosis due to hepatitis C who presents to [facility] ED on 08/24/2022 due to complaints of right-sided facial swelling.  Patient is currently intubated and sedated and no family is currently available, therefore history is obtained from chart review.  Per ED and nursing notes the patient was working outside in her yard yesterday evening.  She did not notice any bug bites or bee stings, however yesterday been began to notice some swelling over the right side of her face with some neck soreness.  This morning the swelling worsened prompting her to seek evaluation in the ED.  She reported that is now becoming difficult to swallow and feels as if her throat is swelling and starting to close then.  She denied any ACE inhibitor use, fever, chills, rash, chest pain, shortness of breath, vomiting, nausea, vomiting, diarrhea, abdominal pain, dysuria.  ED Course: Initial Vital Signs: Temperature 100.1 F orally, respiratory rate 24, pulse 107, blood pressure 192/98, SpO2 95% on room air Significant Labs: Sodium 130, potassium 3.4, glucose 127, total bilirubin 2.3, lactic acid 1.4, procalcitonin 0.53, WBC 12.1 with neutrophilia COVID-19 PCR is negative Imaging CT soft tissue of the Neck>>IMPRESSION: Supraglottic laryngeal edema worse towards the right with airway narrowing. There is also notable right submandibular space and gland inflammation, the primary infectious/inflammatory  cause is uncertain. No collection. Medications Administered: IV Unasyn, 10 mg Decadron, 1 L LR  On exam she was noted to have swelling along the right lower face into the submandibular area with tenderness to palpation, along with some uvular swelling and asymmetric edema on the right side of the posterior oropharynx.  She did not exhibit any stridor or wheezing, but her voice was hoarse and somewhat muffled, she was able to manage secretions without issue.  She was evaluated by ENT who recommended intubating in the OR for airway protection.  PCCM is asked to admit for further workup and treatment postintubation.  Please see "significant hospital events" section below for full detailed hospital course.  Pertinent  Medical History   Past Medical History:  Diagnosis Date   Cirrhosis (HCC)     Micro Data:  5/27: Blood culture x2>> 5/27: COVID-19/RSV/FLU PCR>>negative 5/27: RVP>> 5/27: Group A Strep PCR>> 5/27: Strep pneumo urinary antigen>>   Antimicrobials:   Anti-infectives (From admission, onward)    Start     Dose/Rate Route Frequency Ordered Stop   08/24/22 1800  Ampicillin-Sulbactam (UNASYN) 3 g in sodium chloride 0.9 % 100 mL IVPB        3 g 200 mL/hr over 30 Minutes Intravenous Every 6 hours 08/24/22 1033     08/24/22 0915  Ampicillin-Sulbactam (UNASYN) 3 g in sodium chloride 0.9 % 100 mL IVPB        3 g 200 mL/hr over 30 Minutes Intravenous  Once 08/24/22 0914 08/24/22 1017        Significant Hospital Events: Including procedures, antibiotic start and stop dates in addition to other pertinent events   5/27: Presents to ED with right  sided facial and throat swelling.  ENT intubated in OR for airway protection.   PCCM asked to admit  Interim History / Subjective:  -Patient seen in ICU post intubation by ENT in the OR -Currently sedated and and resting -Hemodynamically stable, no vasopressors currently  Objective   Blood pressure (!) 159/96, pulse 98, temperature  100.1 F (37.8 C), temperature source Oral, resp. rate (!) 24, height 5\' 2"  (1.575 m), weight 79.6 kg, SpO2 94 %.       No intake or output data in the 24 hours ending 08/24/22 1032 Filed Weights   08/24/22 0759  Weight: 79.6 kg    Examination: General: Acute on chronically ill appearing female, laying in bed, intubated and sedated, in NAD HENT: Atraumatic, normocephalic, neck supple, no JVD Lungs: Mechanical breath sounds with inspiratory wheezing to the right side, even, synchronous with the vent Cardiovascular: RRR, s1s2, no M/R/G Abdomen: Soft, nontender, nondistended, no guarding or rebound tenderness, BS+ x4 Extremities: Normal bulk and tone, no deformities, no edema Neuro: Sedated and paralyzed (just arrived from OR), not following commands GU: External female catheter being applied  Resolved Hospital Problem list     Assessment & Plan:   #Intubated for airway protection in setting of right sided Laryngeal and Submandibular space Edema #COPD without acute exacerbation -Full vent support, implement lung protective strategies -Plateau pressures less than 30 cm H20 -Wean FiO2 & PEEP as tolerated to maintain O2 sats >92% -Follow intermittent Chest X-ray & ABG as needed -Spontaneous Breathing Trials when respiratory parameters met, mental status permits, & and with + cuff leak -Implement VAP Bundle -Prn Bronchodilators  #Right sided Laryngeal and Submandibular space Edema, 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 No reported bee sting or bug bites and no associated hives or hypotension to suggest anaphylaxis rxn, does not take ACE-i CT Neck on presentation with supraglottic laryngeal edema (worse on right) with airway narrowing and notable right submandibular space and gland inflammation (but NO abscess collection and uncertain primary infectious/inflammatory etiology) -ENT following, appreciate input ~ recommends 48 hrs of ABX & Steroids, and then repeating CT scan at that time -IV Decadron  10 mg q8h -IV Pepcid and Benadryl -Check CRP and Complement C4  #Meets SIRS Criteria on presentation (HR >100, RR 22, WBC 12.1) #Concern for possible Submandibular/Peritonsillar Infection -Monitor fever curve -Trend WBC's & Procalcitonin -Follow cultures as above -Continue empiric Unasyn pending cultures & sensitivities  #Hypertension -Continuous cardiac monitoring -Maintain MAP >65 -Prn Hydralazine & Labetalol for SBP >170 -IV fluids  #Mild Hyponatremia #Mild Hypokalemia -Monitor I&O's / urinary output -Follow BMP -Ensure adequate renal perfusion -Avoid nephrotoxic agents as able -Replace electrolytes as indicated ~ Pharmacy following for assistance with electrolyte replacement -IV fluids  #Sedation needs in the setting of mechanical ventilation -Maintain a RASS goal of -1 to -2 (higher RASS given need to ensure airway protected/maintained) -Fentanyl and Propofol to maintain RASS goal -Avoid sedating medications as able -Daily wake up assessment     Best Practice (right click and "Reselect all SmartList Selections" daily)   Diet/type: NPO DVT prophylaxis: LMWH GI prophylaxis: H2B Lines: N/A Foley:  N/A Code Status:  full code Last date of multidisciplinary goals of care discussion [N/A]  5/27: Will update pt's family when they arrive at bedside.  Labs   CBC: Recent Labs  Lab 08/24/22 0810  WBC 12.1*  NEUTROABS 10.6*  HGB 15.6*  HCT 43.9  MCV 91.3  PLT PLATELET CLUMPS NOTED ON SMEAR, UNABLE TO ESTIMATE    Basic Metabolic Panel:  Recent Labs  Lab 08/24/22 0810  NA 130*  K 3.4*  CL 99  CO2 22  GLUCOSE 127*  BUN 11  CREATININE 0.78  CALCIUM 8.8*   GFR: Estimated Creatinine Clearance: 72.2 mL/min (by C-G formula based on SCr of 0.78 mg/dL). Recent Labs  Lab 08/24/22 0810  WBC 12.1*  LATICACIDVEN 1.4    Liver Function Tests: Recent Labs  Lab 08/24/22 0810  AST 31  ALT 21  ALKPHOS 67  BILITOT 2.3*  PROT 9.0*  ALBUMIN 3.9   No  results for input(s): "LIPASE", "AMYLASE" in the last 168 hours. No results for input(s): "AMMONIA" in the last 168 hours.  ABG No results found for: "PHART", "PCO2ART", "PO2ART", "HCO3", "TCO2", "ACIDBASEDEF", "O2SAT"   Coagulation Profile: Recent Labs  Lab 08/24/22 0810  INR 1.1    Cardiac Enzymes: No results for input(s): "CKTOTAL", "CKMB", "CKMBINDEX", "TROPONINI" in the last 168 hours.  HbA1C: No results found for: "HGBA1C"  CBG: No results for input(s): "GLUCAP" in the last 168 hours.  Review of Systems:   Unable to assess due to intubation/sedation/critical illness   Past Medical History:  She,  has a past medical history of Cirrhosis (HCC).   Surgical History:  History reviewed. No pertinent surgical history.   Social History:   reports that she has been smoking cigarettes. She has been smoking an average of .5 packs per day. Her smokeless tobacco use includes snuff. She reports current alcohol use. She reports current drug use.   Family History:  Her family history is not on file.   Allergies Allergies  Allergen Reactions   Bee Venom Anaphylaxis and Swelling   Ibuprofen     REACTION: sick on stomach     Home Medications  Prior to Admission medications   Medication Sig Start Date End Date Taking? Authorizing Provider  clonazePAM (KLONOPIN) 0.5 MG tablet Take 1 tablet by mouth 2 (two) times daily. 08/23/12   [provider]  furosemide (LASIX) 20 MG tablet Take 1 tablet by mouth 2 (two) times daily. 11/25/12   [provider]  gabapentin (NEURONTIN) 300 MG capsule Take 1 capsule by mouth 3 (three) times daily. 08/23/12   [provider]  metoprolol tartrate (LOPRESSOR) 50 MG tablet Take 1 tablet by mouth 2 (two) times daily.  08/23/12   [provider]  naloxone [facility]) nasal spray 4 mg/0.1 mL Administer 1 spray as needed into one nostril in the case of decreased breathing and responsiveness thought to be due to narcotics  overdose.  Call 911 and repeat dose every 2 to 3 minutes in alternating nostrils until EMS arrives. 11/18/16   Loleta Rose, MD  OMEPRAZOLE PO Take 1 capsule by mouth 2 (two) times daily.    [provider]  SM MULTIPLE VITAMINS/IRON TABS Take 1 tablet by mouth daily. 08/08/10   [provider]     Critical care time: 50 minutes     Harlon Ditty, AGACNP-BC [facility] Prefer epic messenger for cross cover needs If after hours, please call E-link

## 2022-08-24 NOTE — Progress Notes (Signed)
PHARMACY - PHYSICIAN COMMUNICATION CRITICAL VALUE ALERT - BLOOD CULTURE IDENTIFICATION (BCID)  Sierra Patterson is an 62 y.o. female who presented to [facility] on 08/24/2022 with a chief complaint of right sided laryngeal and right submandibular edema.  Assessment: 4/4 bottles anaerboic GNR - BCID none  Name of physician (or Provider) Contacted: [place]  Current antibiotics: Unasyn  Changes to prescribed antibiotics recommended:  Recommendations declined by provider due to - antibiotics changed from Zosyn to Unasyn per ENT recommendation. Recommended Zosyn based on blood culture results. Provider deferring to day shift for adjustment in antibiotics.  No results found for this or any previous visit.  Elliot Gurney, PharmD, BCPS Clinical Pharmacist  08/24/2022 9:09 PM

## 2022-08-24 NOTE — Anesthesia Preprocedure Evaluation (Signed)
Anesthesia Evaluation  Patient identified by MRN, date of birth, ID band Patient awake    Reviewed: Allergy & Precautions, Patient's Chart, lab work & pertinent test resultsPreop documentation limited or incomplete due to emergent nature of procedure.  History of Anesthesia Complications Negative for: history of anesthetic complications  Airway Mallampati: IV  TM Distance: >3 FB Neck ROM: full  Mouth opening: Limited Mouth Opening  Dental  (+) Dental Advisory Given   Pulmonary COPD, Current Smoker   Pulmonary exam normal        Cardiovascular hypertension, On Medications and On Home Beta Blockers Normal cardiovascular exam     Neuro/Psych  PSYCHIATRIC DISORDERS  Depression    negative neurological ROS     GI/Hepatic negative GI ROS,,,(+) Cirrhosis       , Hepatitis -, C  Endo/Other  negative endocrine ROS    Renal/GU      Musculoskeletal   Abdominal   Peds  Hematology negative hematology ROS (+)   Anesthesia Other Findings Past Medical History: No date: Cirrhosis (HCC)  History reviewed. No pertinent surgical history.  BMI    Body Mass Index: 32.10 kg/m      Reproductive/Obstetrics negative OB ROS                             Anesthesia Physical Anesthesia Plan  ASA: 3 and emergent  Anesthesia Plan: General ETT and MAC   Post-op Pain Management:    Induction:   PONV Risk Score and Plan: 2 and Treatment may vary due to age or medical condition  Airway Management Planned: Nasal ETT  Additional Equipment:   Intra-op Plan:   Post-operative Plan: Post-operative intubation/ventilation  Informed Consent: I have reviewed the patients History and Physical, chart, labs and discussed the procedure including the risks, benefits and alternatives for the proposed anesthesia with the patient or authorized representative who has indicated his/her understanding and acceptance.      Dental Advisory Given  Plan Discussed with: Anesthesiologist, CRNA and Surgeon  Anesthesia Plan Comments: (Patient consented for risks of anesthesia including but not limited to:  - adverse reactions to medications - damage to eyes, teeth, lips or other oral mucosa - nerve damage due to positioning  - sore throat or hoarseness - Damage to heart, brain, nerves, lungs, other parts of body or loss of life  Patient voiced understanding.)       Anesthesia Quick Evaluation

## 2022-08-24 NOTE — Consult Note (Signed)
PHARMACY CONSULT NOTE - FOLLOW UP  Pharmacy Consult for Electrolyte Monitoring and Replacement   Recent Labs: Potassium (mmol/L)  Date Value  08/24/2022 3.4 (L)  01/10/2014 3.7   Calcium (mg/dL)  Date Value  16/12/9602 8.8 (L)   Calcium, Total (mg/dL)  Date Value  54/11/8117 8.3 (L)   Albumin (g/dL)  Date Value  14/78/2956 3.9  01/10/2014 3.4   Sodium (mmol/L)  Date Value  08/24/2022 130 (L)  01/10/2014 142     Assessment: Patient presented with complaint of right sided lower jaw swelling. Side of face and neck are swollen.    Goal of Therapy:  WNL  Plan:  KCL 10 mEq IV x 2 F/u with AM labs.   Ronnald Ramp ,PharmD Clinical Pharmacist 08/24/2022 10:34 AM

## 2022-08-24 NOTE — Consult Note (Signed)
[place], [place] [id number] [birth year] Erin Fulling, MD  Reason for Consult: Airway obstruction  HPI: 62 year old female who yesterday evening developed severe sore throat on the right-hand side.  Presented the emergency room today with increasing difficulty with swallowing and breathing and right-sided neck swelling.  CT scan showed significant supraglottic edema and right submandibular gland swelling asked to evaluate for possible airway obstruction.  Allergies:  Allergies  Allergen Reactions   Bee Venom Anaphylaxis and Swelling   Ibuprofen     REACTION: sick on stomach    ROS: Review of systems normal other than 12 systems except per HPI.  PMH:  Past Medical History:  Diagnosis Date   Cirrhosis (HCC)     FH: History reviewed. No pertinent family history.  SH:  Social History   Socioeconomic History   Marital status: Widowed    Spouse name: Not on file   Number of children: Not on file   Years of education: Not on file   Highest education level: Not on file  Occupational History   Not on file  Tobacco Use   Smoking status: Some Days    Packs/day: .5    Types: Cigarettes   Smokeless tobacco: Current    Types: Snuff  Vaping Use   Vaping Use: Never used  Substance and Sexual Activity   Alcohol use: Yes    Comment: Occ   Drug use: Yes    Comment: Prescription pills occassionally   Sexual activity: Not on file  Other Topics Concern   Not on file  Social History Narrative   Not on file   Social Determinants of Health   Financial Resource Strain: Not on file  Food Insecurity: No Food Insecurity (08/24/2022)   Hunger Vital Sign    Worried About Running Out of Food in the Last Year: Never true    Ran Out of Food in the Last Year: Never true  Transportation Needs: No Transportation Needs (08/24/2022)   PRAPARE - Administrator, Civil Service (Medical): No    Lack of Transportation (Non-Medical): No  Physical Activity: Not on file  Stress: Not on file   Social Connections: Not on file  Intimate Partner Violence: Not At Risk (08/24/2022)   Humiliation, Afraid, Rape, and Kick questionnaire    Fear of Current or Ex-Partner: No    Emotionally Abused: No    Physically Abused: No    Sexually Abused: No    PSH: History reviewed. No pertinent surgical history.  Physical  Exam: Patient sitting upright in ER bed with obvious mild stridor and difficulty swallowing.  Voice somewhat hot potato.  The external ears are benign anterior nose benign oral cavity oropharynx shows significant uvular swelling and edema there is significant floor mouth swelling.  The patient appears edentulous.  Palpation the neck showed tenderness particular in the right submandibular region.  Procedure: Flexible fiberoptic laryngoscopy performed at bedside-with no anesthetic flex fiberoptic Lrygoscope was introduced through the right nostril.  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  The flexible scope was passed posteriorly there was obviously swelling of the uvula examination the tongue base showed moderate swelling there was significant supraglottic edema involving the right tongue base the epiglottis circumferentially in the right hypopharynx.  The vocal folds beneath the epiglottis appeared relatively normal.  A/P: Floor mouth and supraglottic edema consistent with Ludwig's angina.  She is not on an ACE inhibitor no history of anything different in her diet which could have  contributed to angioedema.  My suspicion is this is infectious related.  I had a long talk with Felecia I do think she has impending airway obstruction, I have recommended emergent nasotracheal intubation or possible tracheotomy.  I discussed with her the risk and benefits including bleeding infection airway obstruction and death.  I have spoken with her father Harrel Carina about the impending airway issue and he agrees with proceeding with intubation.  We will take her  emergently to the operating room for airway security.  ER time approximately 30 minutes   [name] 08/24/2022 10:53 AM

## 2022-08-24 NOTE — ED Triage Notes (Signed)
Patient cones in via [facility] with complaints of right sided lower jaw swelling that goes goes down into the right side of her neck. According to EMS, the patient was out working in the yard yesterday evening. Around 7pm last night the patient noticed that the side of her face and neck began getting sore and swollen. Patient unsure if she was bitten by something. The patient states that it got worse this morning, and that she feels like her throat is closing in, and it is hard to swallow. Patient is alert and oriented x4, and complaining pain 8/10.  EMS Vitals  Bp:196/109 93%RA 120 hr

## 2022-08-24 NOTE — Consult Note (Signed)
CODE SEPSIS - PHARMACY COMMUNICATION  **Broad Spectrum Antibiotics should be administered within 1 hour of Sepsis diagnosis**  Time Code Sepsis Called/Page Received: 0914  Antibiotics Ordered: unasyn  Time of 1st antibiotic administration: 0937  Additional action taken by pharmacy: None     Ronnald Ramp ,PharmD Clinical Pharmacist  08/24/2022  9:43 AM

## 2022-08-24 NOTE — ED Notes (Signed)
Patient transported to CT 

## 2022-08-25 ENCOUNTER — Inpatient Hospital Stay: Payer: Medicare Other

## 2022-08-25 ENCOUNTER — Inpatient Hospital Stay (HOSPITAL_COMMUNITY)
Admit: 2022-08-25 | Discharge: 2022-08-25 | Disposition: A | Discharge status: Home / Self Care | Payer: Medicare Other | Attending: Pulmonary Disease | Admitting: Pulmonary Disease

## 2022-08-25 DIAGNOSIS — A419 Sepsis, unspecified organism: Secondary | ICD-10-CM

## 2022-08-25 DIAGNOSIS — B182 Chronic viral hepatitis C: Secondary | ICD-10-CM

## 2022-08-25 DIAGNOSIS — R7881 Bacteremia: Secondary | ICD-10-CM

## 2022-08-25 DIAGNOSIS — J384 Edema of larynx: Secondary | ICD-10-CM

## 2022-08-25 DIAGNOSIS — J9601 Acute respiratory failure with hypoxia: Secondary | ICD-10-CM

## 2022-08-25 DIAGNOSIS — K122 Cellulitis and abscess of mouth: Secondary | ICD-10-CM

## 2022-08-25 LAB — HEMOGLOBIN A1C
Hgb A1c MFr Bld: 5 % (ref 4.8–5.6)
Mean Plasma Glucose: 97 mg/dL

## 2022-08-25 LAB — BLOOD CULTURE ID PANEL (REFLEXED) - BCID2

## 2022-08-25 LAB — POTASSIUM: Potassium: 3.8 mmol/L (ref 3.5–5.1)

## 2022-08-25 LAB — GLUCOSE, CAPILLARY
Glucose-Capillary: 126 mg/dL — ABNORMAL HIGH (ref 70–99)
Glucose-Capillary: 130 mg/dL — ABNORMAL HIGH (ref 70–99)
Glucose-Capillary: 125 mg/dL — ABNORMAL HIGH (ref 70–99)
Glucose-Capillary: 117 mg/dL — ABNORMAL HIGH (ref 70–99)
Glucose-Capillary: 125 mg/dL — ABNORMAL HIGH (ref 70–99)
Glucose-Capillary: 119 mg/dL — ABNORMAL HIGH (ref 70–99)

## 2022-08-25 LAB — CBC
HCT: 36.6 % (ref 36.0–46.0)
Hemoglobin: 13.3 g/dL (ref 12.0–15.0)
MCH: 33.2 pg (ref 26.0–34.0)
MCHC: 36.3 g/dL — ABNORMAL HIGH (ref 30.0–36.0)
MCV: 91.3 fL (ref 80.0–100.0)
Platelets: 110 10*3/uL — ABNORMAL LOW (ref 150–400)
RBC: 4.01 MIL/uL (ref 3.87–5.11)
RDW: 13.6 % (ref 11.5–15.5)
WBC: 15 10*3/uL — ABNORMAL HIGH (ref 4.0–10.5)
nRBC: 0 % (ref 0.0–0.2)

## 2022-08-25 LAB — RESPIRATORY PANEL BY PCR

## 2022-08-25 LAB — C4 COMPLEMENT: Complement C4, Body Fluid: 20 mg/dL (ref 12–38)

## 2022-08-25 LAB — ECHOCARDIOGRAM COMPLETE
AR max vel: 2.08 cm2
AV Area VTI: 2.23 cm2
AV Area mean vel: 2.25 cm2
AV Mean grad: 5 mmHg
AV Peak grad: 11.4 mmHg
Ao pk vel: 1.69 m/s
Area-P 1/2: 2.01 cm2
Height: 62 in
MV VTI: 0.69 cm2
S' Lateral: 2.58 cm
Weight: 3086.44 oz

## 2022-08-25 LAB — MAGNESIUM
Magnesium: 1.8 mg/dL (ref 1.7–2.4)
Magnesium: 2 mg/dL (ref 1.7–2.4)

## 2022-08-25 LAB — RENAL FUNCTION PANEL
Albumin: 2.8 g/dL — ABNORMAL LOW (ref 3.5–5.0)
Anion gap: 10 (ref 5–15)
BUN: 21 mg/dL (ref 8–23)
CO2: 23 mmol/L (ref 22–32)
Calcium: 8.5 mg/dL — ABNORMAL LOW (ref 8.9–10.3)
Chloride: 101 mmol/L (ref 98–111)
Creatinine, Ser: 0.81 mg/dL (ref 0.44–1.00)
GFR, Estimated: >60 mL/min (ref >60)
Glucose, Bld: 137 mg/dL — ABNORMAL HIGH (ref 70–99)
Phosphorus: 2.8 mg/dL (ref 2.5–4.6)
Potassium: 3.7 mmol/L (ref 3.5–5.1)
Sodium: 134 mmol/L — ABNORMAL LOW (ref 135–145)

## 2022-08-25 LAB — URINE DRUG SCREEN, QUALITATIVE (ARMC ONLY)
Amphetamines, Ur Screen: POSITIVE — AB
Barbiturates, Ur Screen: NOT DETECTED
Benzodiazepine, Ur Scrn: POSITIVE — AB
Cannabinoid 50 Ng, Ur ~~LOC~~: NOT DETECTED
Cocaine Metabolite,Ur ~~LOC~~: NOT DETECTED
MDMA (Ecstasy)Ur Screen: NOT DETECTED
Methadone Scn, Ur: NOT DETECTED
Opiate, Ur Screen: NOT DETECTED
Phencyclidine (PCP) Ur S: NOT DETECTED
Tricyclic, Ur Screen: POSITIVE — AB

## 2022-08-25 LAB — HIV ANTIBODY (ROUTINE TESTING W REFLEX): HIV Screen 4th Generation wRfx: NONREACTIVE

## 2022-08-25 LAB — TRIGLYCERIDES: Triglycerides: 48 mg/dL (ref <150)

## 2022-08-25 LAB — STREP PNEUMONIAE URINARY ANTIGEN: Strep Pneumo Urinary Antigen: NEGATIVE

## 2022-08-25 LAB — TROPONIN I (HIGH SENSITIVITY)
Troponin I (High Sensitivity): 29 ng/L — ABNORMAL HIGH (ref <18)
Troponin I (High Sensitivity): 24 ng/L — ABNORMAL HIGH (ref <18)

## 2022-08-25 LAB — CULTURE, BLOOD (ROUTINE X 2)

## 2022-08-25 LAB — C-REACTIVE PROTEIN: CRP: 7.4 mg/dL — ABNORMAL HIGH (ref <1.0)

## 2022-08-25 LAB — PROCALCITONIN: Procalcitonin: 5.4 ng/mL

## 2022-08-25 MED ORDER — CLONAZEPAM 0.25 MG PO TBDP
0.5000 mg | ORAL_TABLET | Freq: Two times a day (BID) | ORAL | Status: DC
  Administered 2022-08-25 – 2022-09-03 (×15): 0.5 mg
  Filled 2022-08-25: qty 2
  Filled 2022-08-25 (×8): qty 1
  Filled 2022-08-25: qty 2
  Filled 2022-08-25 (×5): qty 1

## 2022-08-25 MED ORDER — CLONAZEPAM 0.1 MG/ML ORAL SUSPENSION: 0.5000 mg | Freq: Two times a day (BID) | ORAL | Status: DC

## 2022-08-25 MED ORDER — FREE WATER
30.0000 mL | Status: DC
  Administered 2022-08-25 – 2022-08-31 (×38): 30 mL

## 2022-08-25 MED ORDER — VITAL AF 1.2 CAL PO LIQD
1000.0000 mL | ORAL | Status: DC
  Administered 2022-08-25 – 2022-08-31 (×6): 1000 mL

## 2022-08-25 MED ORDER — CHLORHEXIDINE GLUCONATE CLOTH 2 % EX PADS
6.0000 | MEDICATED_PAD | Freq: Every evening | CUTANEOUS | Status: DC
  Administered 2022-08-25 – 2022-09-02 (×9): 6 via TOPICAL

## 2022-08-25 MED ORDER — PROSOURCE TF20 ENFIT COMPATIBL EN LIQD
60.0000 mL | Freq: Every day | ENTERAL | Status: DC
  Administered 2022-08-26 – 2022-08-30 (×5): 60 mL

## 2022-08-25 MED ORDER — PIPERACILLIN-TAZOBACTAM 3.375 G IVPB
3.3750 g | Freq: Three times a day (TID) | INTRAVENOUS | Status: DC
  Administered 2022-08-25 – 2022-08-27 (×6): 3.375 g via INTRAVENOUS
  Filled 2022-08-25 (×6): qty 50

## 2022-08-25 MED FILL — Fentanyl Citrate Preservative Free (PF) Inj 2500 MCG/50ML: INTRAMUSCULAR | Qty: 50 | Status: AC

## 2022-08-25 MED FILL — Sodium Chloride IV Soln 0.9%: INTRAVENOUS | Qty: 250 | Status: AC

## 2022-08-25 NOTE — Anesthesia Postprocedure Evaluation (Signed)
Anesthesia Post Note  Patient: Sierra Patterson  Procedure(s) Performed: NASAL INTUBATION  Patient location during evaluation: ICU Anesthesia Type: MAC and General Level of consciousness: patient remains intubated per anesthesia plan and sedated Vital Signs Assessment: post-procedure vital signs reviewed and stable Respiratory status: patient on ventilator - see flowsheet for VS Cardiovascular status: stable Anesthetic complications: no   Encounter Notable Events  Notable Event Outcome Phase Comment  Difficult to intubate - expected  Intraprocedure Filed from anesthesia note documentation.     Last Vitals:  Vitals:   08/25/22 0500 08/25/22 0555  BP: 95/62   Pulse: (!) 58   Resp: 16   Temp: (!) 36.4 C 37.1 C  SpO2: 99%     Last Pain:  Vitals:   08/25/22 0555  TempSrc: Oral  PainSc:                  Jaye Beagle

## 2022-08-25 NOTE — Consult Note (Signed)
NAME: Sierra Patterson  DOB: 10-14-1960  MRN: [id number]  Date/Time: 08/25/2022 11:11 AM  REQUESTING PROVIDERJeremiah Elvina Sidle Subjective:  REASON FOR CONSULT: bacteremia ?No history available from patient as she is intubated Sierra Patterson is a 62 y.o. female with a history of HEPC failed treatment with PEG/RVN in 2012, unclear whether she was treated again, cirrhosis Brought in by EMS for swelling rt side of the neck/lower face X 1 day. Was spitting saliva In the ED vitals BP 159/96  08/24/22 07:57  Temp 100.1 F (37.8 C)  Pulse Rate 107 !  Resp 24 !  SpO2 95 %   Labs revealed  Latest Reference Range & Units 08/24/22 08:10  WBC 4.0 - 10.5 K/uL 12.1 (H)  Hemoglobin 12.0 - 15.0 g/dL 78.2 (H)  HCT 95.6 - 21.3 % 43.9  Platelets 150 - 400 K/uL PLATELET CLUMPS NOTED ON SMEAR, UNABLE TO ESTIMATE  Creatinine 0.44 - 1.00 mg/dL 0.86  Pt had Ct which showed supraglottic edema, started on unasyn she was seen by ENT for ludwigs angina and intubated nasally to protect airway. She is in ICU on low dose pressor She is now on  zosyn Blood culture 4/4 gram neg bacteremia I am seeing the patient for the same  Past Medical History:  Diagnosis Date   Cirrhosis (HCC)    Hepatitis C History reviewed. No pertinent surgical history.  Social History   Socioeconomic History   Marital status: Widowed    Spouse name: Not on file   Number of children: Not on file   Years of education: Not on file   Highest education level: Not on file  Occupational History   Not on file  Tobacco Use   Smoking status: Some Days    Packs/day: .5    Types: Cigarettes   Smokeless tobacco: Current    Types: Snuff  Vaping Use   Vaping Use: Never used  Substance and Sexual Activity   Alcohol use: Yes    Comment: Occ   Drug use: Yes    Comment: Prescription pills occassionally   Sexual activity: Not on file  Other Topics Concern   Not on file  Social History Narrative   Not on file   Social Determinants of  Health   Financial Resource Strain: Not on file  Food Insecurity: No Food Insecurity (08/24/2022)   Hunger Vital Sign    Worried About Running Out of Food in the Last Year: Never true    Ran Out of Food in the Last Year: Never true  Transportation Needs: No Transportation Needs (08/24/2022)   PRAPARE - Administrator, Civil Service (Medical): No    Lack of Transportation (Non-Medical): No  Physical Activity: Not on file  Stress: Not on file  Social Connections: Not on file  Intimate Partner Violence: Not At Risk (08/24/2022)   Humiliation, Afraid, Rape, and Kick questionnaire    Fear of Current or Ex-Partner: No    Emotionally Abused: No    Physically Abused: No    Sexually Abused: No    History reviewed. No pertinent family history. Allergies  Allergen Reactions   Bee Venom Anaphylaxis and Swelling   Ibuprofen     REACTION: sick on stomach   I? Current Facility-Administered Medications  Medication Dose Route Frequency Provider Last Rate Last Admin   0.9 %  sodium chloride infusion   Intravenous PRN Erin Fulling, MD   Stopped at 08/24/22 2108   0.9 %  sodium chloride infusion  250  mL Intravenous Continuous Rust-Chester, Cecelia Byars, NP   Held at 08/24/22 2310   acetaminophen (TYLENOL) tablet 650 mg  650 mg Oral Q4H PRN Judithe Modest, NP       Chlorhexidine Gluconate Cloth 2 % PADS 6 each  6 each Topical Nightly Dgayli, Khabib, MD       clonazePAM (KLONOPIN) disintegrating tablet 0.5 mg  0.5 mg Per Tube BID Harlon Ditty D, NP   0.5 mg at 08/25/22 0918   dexamethasone (DECADRON) injection 10 mg  10 mg Intravenous Q8H Kasa, Wallis Bamberg, MD   10 mg at 08/25/22 0740   diphenhydrAMINE (BENADRYL) injection 25 mg  25 mg Intravenous Q8H Harlon Ditty D, NP   25 mg at 08/25/22 0551   docusate (COLACE) 50 MG/5ML liquid 100 mg  100 mg Per Tube BID Harlon Ditty D, NP   100 mg at 08/25/22 1610   docusate sodium (COLACE) capsule 100 mg  100 mg Oral BID PRN Judithe Modest, NP        enoxaparin (LOVENOX) injection 40 mg  40 mg Subcutaneous Q24H Harlon Ditty D, NP   40 mg at 08/24/22 2107   famotidine (PEPCID) IVPB 20 mg premix  20 mg Intravenous Q12H Harlon Ditty D, NP   Paused at 08/25/22 0949   fentaNYL (SUBLIMAZE) bolus via infusion 50-100 mcg  50-100 mcg Intravenous Q15 min PRN Judithe Modest, NP   50 mcg at 08/24/22 1832   fentaNYL in NS (30mcg/ml) infusion-PREMIX  50-200 mcg/hr Intravenous Continuous Harlon Ditty D, NP 12.5 mL/hr at 08/25/22 1000 125 mcg/hr at 08/25/22 1000   insulin aspart (novoLOG) injection 0-15 Units  0-15 Units Subcutaneous Q4H Harlon Ditty D, NP   2 Units at 08/25/22 0748   ipratropium-albuterol (DUONEB) 0.5-2.5 (3) MG/3ML nebulizer solution 3 mL  3 mL Nebulization Q4H PRN Judithe Modest, NP       lactated ringers infusion   Intravenous Continuous Judithe Modest, NP 75 mL/hr at 08/25/22 1000 Infusion Verify at 08/25/22 1000   norepinephrine (LEVOPHED) 4mg  in (0.016 mg/mL) premix infusion  2-10 mcg/min Intravenous Titrated Rust-Chester, Britton L, NP 7.5 mL/hr at 08/25/22 1000 2 mcg/min at 08/25/22 1000   piperacillin-tazobactam (ZOSYN) IVPB 3.375 g  3.375 g Intravenous Q8H Nazari, Walid A, RPH 12.5 mL/hr at 08/25/22 1025 3.375 g at 08/25/22 1025   polyethylene glycol (MIRALAX / GLYCOLAX) packet 17 g  17 g Oral Daily PRN Judithe Modest, NP       polyethylene glycol (MIRALAX / GLYCOLAX) packet 17 g  17 g Per Tube Daily Harlon Ditty D, NP   17 g at 08/25/22 0918   propofol (DIPRIVAN) 1000 MG/100ML infusion  0-50 mcg/kg/min Intravenous Continuous Judithe Modest, NP 7.16 mL/hr at 08/25/22 1000 15 mcg/kg/min at 08/25/22 1000     Abtx:  Anti-infectives (From admission, onward)    Start     Dose/Rate Route Frequency Ordered Stop   08/25/22 1000  piperacillin-tazobactam (ZOSYN) IVPB 3.375 g        3.375 g 12.5 mL/hr over 240 Minutes Intravenous Every 8 hours 08/25/22 0808     08/24/22 1800   Ampicillin-Sulbactam (UNASYN) 3 g in sodium chloride 0.9 % 100 mL IVPB  Status:  Discontinued        3 g 200 mL/hr over 30 Minutes Intravenous Every 6 hours 08/24/22 1033 08/25/22 0803   08/24/22 0915  Ampicillin-Sulbactam (UNASYN) 3 g in sodium chloride 0.9 % 100 mL IVPB  3 g 200 mL/hr over 30 Minutes Intravenous  Once 08/24/22 0914 08/24/22 1017       REVIEW OF SYSTEMS:  NA Objective:  VITALS:  BP 125/72   Pulse (!) 49   Temp (!) 96.8 F (36 C)   Resp (!) 21   Ht 5\' 2"  (1.575 m)   Wt 87.5 kg   SpO2 97%   BMI 35.28 kg/m   PHYSICAL EXAM:  General: intubated on fentanyl and propofol Trying to get up Head: Normocephalic, without obvious abnormality, atraumatic. Eyes: Conjunctivae clear, anicteric sclerae. Pupils are equal ENT Swelling over the chin and neck- left side also looks swollen Tanned skin  Lungs:b/l air entry Heart: Tachycardia Abdomen: Soft, non-tender,not distended. Bowel sounds normal. No masses Extremities: atraumatic, no cyanosis. No edema. No clubbing Skin: Tanned skin especially over face, chest and arms Neurologic: cannot assess Pertinent Labs Lab Results CBC    Component Value Date/Time   WBC 15.0 (H) 08/25/2022 0547   RBC 4.01 08/25/2022 0547   HGB 13.3 08/25/2022 0547   HGB 15.0 01/10/2014 1320   HCT 36.6 08/25/2022 0547   HCT 45.1 01/10/2014 1320   PLT 110 (L) 08/25/2022 0547   PLT 69 (L) 01/10/2014 1320   MCV 91.3 08/25/2022 0547   MCV 103 (H) 01/10/2014 1320   MCH 33.2 08/25/2022 0547   MCHC 36.3 (H) 08/25/2022 0547   RDW 13.6 08/25/2022 0547   RDW 13.8 01/10/2014 1320   LYMPHSABS 0.7 08/24/2022 0810   LYMPHSABS 0.7 (L) 01/10/2014 1320   MONOABS 0.7 08/24/2022 0810   MONOABS 0.3 01/10/2014 1320   EOSABS 0.0 08/24/2022 0810   EOSABS 0.1 01/10/2014 1320   BASOSABS 0.0 08/24/2022 0810   BASOSABS 0.0 01/10/2014 1320       Latest Ref Rng & Units 08/25/2022   10:17 AM 08/25/2022    5:47 AM 08/24/2022    8:10 AM  CMP   Glucose 70 - 99 mg/dL  540  981   BUN 8 - 23 mg/dL  21  11   Creatinine 1.91 - 1.00 mg/dL  4.78  2.95   Sodium 621 - 145 mmol/L  134  130   Potassium 3.5 - 5.1 mmol/L 3.8  3.7  3.4   Chloride 98 - 111 mmol/L  101  99   CO2 22 - 32 mmol/L  23  22   Calcium 8.9 - 10.3 mg/dL  8.5  8.8   Total Protein 6.5 - 8.1 g/dL   9.0   Total Bilirubin 0.3 - 1.2 mg/dL   2.3   Alkaline Phos 38 - 126 U/L   67   AST 15 - 41 U/L   31   ALT 0 - 44 U/L   21       Microbiology: Recent Results (from the past 240 hour(s))  Resp panel by RT-PCR (RSV, Flu A&B, Covid) Anterior Nasal Swab     Status: None   Collection Time: 08/24/22  9:24 AM   Specimen: Anterior Nasal Swab  Result Value Ref Range Status   SARS Coronavirus 2 by RT PCR NEGATIVE NEGATIVE Final    Comment: (NOTE) SARS-CoV-2 target nucleic acids are NOT DETECTED.  The SARS-CoV-2 RNA is generally detectable in upper respiratory specimens during the acute phase of infection. The lowest concentration of SARS-CoV-2 viral copies this assay can detect is 138 copies/mL. A negative result does not preclude SARS-Cov-2 infection and should not be used as the sole basis for treatment or other patient management decisions. A  negative result may occur with  improper specimen collection/handling, submission of specimen other than nasopharyngeal swab, presence of viral mutation(s) within the areas targeted by this assay, and inadequate number of viral copies(<138 copies/mL). A negative result must be combined with clinical observations, patient history, and epidemiological information. The expected result is Negative.  Fact Sheet for Patients:  BloggerCourse.com  Fact Sheet for Healthcare Providers:  SeriousBroker.it  This test is no t yet approved or cleared by the [place] FDA and  has been authorized for detection and/or diagnosis of SARS-CoV-2 by FDA under an Emergency Use Authorization  (EUA). This EUA will remain  in effect (meaning this test can be used) for the duration of the COVID-19 declaration under Section 564(b)(1) of the Act, 21 U.S.C.section 360bbb-3(b)(1), unless the authorization is terminated  or revoked sooner.       Influenza A by PCR NEGATIVE NEGATIVE Final   Influenza B by PCR NEGATIVE NEGATIVE Final    Comment: (NOTE) The Xpert Xpress SARS-CoV-2/FLU/RSV plus assay is intended as an aid in the diagnosis of influenza from Nasopharyngeal swab specimens and should not be used as a sole basis for treatment. Nasal washings and aspirates are unacceptable for Xpert Xpress SARS-CoV-2/FLU/RSV testing.  Fact Sheet for Patients: BloggerCourse.com  Fact Sheet for Healthcare Providers: SeriousBroker.it  This test is not yet approved or cleared by the [place] FDA and has been authorized for detection and/or diagnosis of SARS-CoV-2 by FDA under an Emergency Use Authorization (EUA). This EUA will remain in effect (meaning this test can be used) for the duration of the COVID-19 declaration under Section 564(b)(1) of the Act, 21 U.S.C. section 360bbb-3(b)(1), unless the authorization is terminated or revoked.     Resp Syncytial Virus by PCR NEGATIVE NEGATIVE Final    Comment: (NOTE) Fact Sheet for Patients: BloggerCourse.com  Fact Sheet for Healthcare Providers: SeriousBroker.it  This test is not yet approved or cleared by the [place] FDA and has been authorized for detection and/or diagnosis of SARS-CoV-2 by FDA under an Emergency Use Authorization (EUA). This EUA will remain in effect (meaning this test can be used) for the duration of the COVID-19 declaration under Section 564(b)(1) of the Act, 21 U.S.C. section 360bbb-3(b)(1), unless the authorization is terminated or revoked.  Performed at [facility], [address]  [place], [place] [address]   Blood Culture (routine x 2)     Status: None (Preliminary result)   Collection Time: 08/24/22  9:24 AM   Specimen: BLOOD  Result Value Ref Range Status   Specimen Description   Final    BLOOD RIGHT [facility] Performed at [facility], [address], [place], [place] [address]    Special Requests   Final    BOTTLES DRAWN AEROBIC AND ANAEROBIC Blood Culture results may not be optimal due to an excessive volume of blood received in culture bottles Performed at [facility], [address] [place], [place] [address]    Culture  Setup Time   Final    GRAM NEGATIVE RODS IN BOTH AEROBIC AND ANAEROBIC BOTTLES CRITICAL VALUE NOTED.  VALUE IS CONSISTENT WITH PREVIOUSLY REPORTED AND CALLED VALUE.    Culture GRAM NEGATIVE RODS  Final   Report Status PENDING  Incomplete  Blood Culture (routine x 2)     Status: None (Preliminary result)   Collection Time: 08/24/22  9:24 AM   Specimen: BLOOD  Result Value Ref Range Status   Specimen Description   Final    BLOOD  LEFT FA Performed at [facility], [address] [place], [place] [address]    Special Requests   Final    BOTTLES DRAWN AEROBIC AND ANAEROBIC Blood Culture results may not be optimal due to an excessive volume of blood received in culture bottles Performed at [facility], [address] [place], [place] [address]    Culture  Setup Time   Final    GRAM NEGATIVE RODS IN BOTH AEROBIC AND ANAEROBIC BOTTLES Organism ID to follow CRITICAL RESULT CALLED TO, READ BACK BY AND VERIFIED WITH: CAROLINE CHILDS 08/24/22 2041 MU    Culture GRAM NEGATIVE RODS  Final   Report Status PENDING  Incomplete  Blood Culture ID Panel (Reflexed)     Status: None   Collection Time: 08/24/22  9:24 AM  Result Value Ref Range Status   Enterococcus faecalis NOT DETECTED NOT DETECTED Final   Enterococcus Faecium NOT DETECTED NOT DETECTED Final   Listeria monocytogenes NOT DETECTED NOT DETECTED  Final   Staphylococcus species NOT DETECTED NOT DETECTED Final   Staphylococcus aureus (BCID) NOT DETECTED NOT DETECTED Final   Staphylococcus epidermidis NOT DETECTED NOT DETECTED Final   Staphylococcus lugdunensis NOT DETECTED NOT DETECTED Final   Streptococcus species NOT DETECTED NOT DETECTED Final   Streptococcus agalactiae NOT DETECTED NOT DETECTED Final   Streptococcus pneumoniae NOT DETECTED NOT DETECTED Final   Streptococcus pyogenes NOT DETECTED NOT DETECTED Final   A.calcoaceticus-baumannii NOT DETECTED NOT DETECTED Final   Bacteroides fragilis NOT DETECTED NOT DETECTED Final   Enterobacterales NOT DETECTED NOT DETECTED Final   Enterobacter cloacae complex NOT DETECTED NOT DETECTED Final   Escherichia coli NOT DETECTED NOT DETECTED Final   Klebsiella aerogenes NOT DETECTED NOT DETECTED Final   Klebsiella oxytoca NOT DETECTED NOT DETECTED Final   Klebsiella pneumoniae NOT DETECTED NOT DETECTED Final   Proteus species NOT DETECTED NOT DETECTED Final   Salmonella species NOT DETECTED NOT DETECTED Final   Serratia marcescens NOT DETECTED NOT DETECTED Final   Haemophilus influenzae NOT DETECTED NOT DETECTED Final   Neisseria meningitidis NOT DETECTED NOT DETECTED Final   Pseudomonas aeruginosa NOT DETECTED NOT DETECTED Final   Stenotrophomonas maltophilia NOT DETECTED NOT DETECTED Final   Candida albicans NOT DETECTED NOT DETECTED Final   Candida auris NOT DETECTED NOT DETECTED Final   Candida glabrata NOT DETECTED NOT DETECTED Final   Candida krusei NOT DETECTED NOT DETECTED Final   Candida parapsilosis NOT DETECTED NOT DETECTED Final   Candida tropicalis NOT DETECTED NOT DETECTED Final   Cryptococcus neoformans/gattii NOT DETECTED NOT DETECTED Final    Comment: Performed at [facility], [address] [place], [place] [address]  MRSA Next Gen by PCR, Nasal     Status: None   Collection Time: 08/24/22 12:24 PM   Specimen: Nasal Mucosa; Nasal Swab  Result Value  Ref Range Status   MRSA by PCR Next Gen NOT DETECTED NOT DETECTED Final    Comment: (NOTE) The GeneXpert MRSA Assay (FDA approved for NASAL specimens only), is one component of a comprehensive MRSA colonization surveillance program. It is not intended to diagnose MRSA infection nor to guide or monitor treatment for MRSA infections. Test performance is not FDA approved in patients less than 57 years old. Performed at [facility], [address] [place], [place] [address]   Respiratory (~20 pathogens) panel by PCR     Status: None   Collection Time: 08/24/22 12:49 PM   Specimen: Nasopharyngeal Swab; Respiratory  Result Value  Ref Range Status   Adenovirus NOT DETECTED NOT DETECTED Final   Coronavirus 229E NOT DETECTED NOT DETECTED Final    Comment: (NOTE) The Coronavirus on the Respiratory Panel, DOES NOT test for the novel  Coronavirus (2019 nCoV)    Coronavirus HKU1 NOT DETECTED NOT DETECTED Final   Coronavirus NL63 NOT DETECTED NOT DETECTED Final   Coronavirus OC43 NOT DETECTED NOT DETECTED Final   Metapneumovirus NOT DETECTED NOT DETECTED Final   Rhinovirus / Enterovirus NOT DETECTED NOT DETECTED Final   Influenza A NOT DETECTED NOT DETECTED Final   Influenza B NOT DETECTED NOT DETECTED Final   Parainfluenza Virus 1 NOT DETECTED NOT DETECTED Final   Parainfluenza Virus 2 NOT DETECTED NOT DETECTED Final   Parainfluenza Virus 3 NOT DETECTED NOT DETECTED Final   Parainfluenza Virus 4 NOT DETECTED NOT DETECTED Final   Respiratory Syncytial Virus NOT DETECTED NOT DETECTED Final   Bordetella pertussis NOT DETECTED NOT DETECTED Final   Bordetella Parapertussis NOT DETECTED NOT DETECTED Final   Chlamydophila pneumoniae NOT DETECTED NOT DETECTED Final   Mycoplasma pneumoniae NOT DETECTED NOT DETECTED Final    Comment: Performed at [facility], [address], [place], [place] [address]  Group A Strep by PCR     Status: None   Collection Time: 08/24/22  3:49 PM    Specimen: Throat; Sterile Swab  Result Value Ref Range Status   Group A Strep by PCR NOT DETECTED NOT DETECTED Final    Comment: Performed at [facility], [address], [place], [place] [address]    IMAGING RESULTS:  I have personally reviewed the films ?supraglottic laryngeal edema more on the righ causing airway narrowing Rt sided submandibular space and gland swelling  Impression/Recommendation ? Ludwigs angina with airway narrowing needing urgent intubation by ENT Pt currently on zosyn and decadron Intubated as impending airway compromise  Gram negative bacteremia 4/4- with ludwigs angina concern would be for fusobacterium which is a gram neg oral organism and an anerobe- since the bacteria is growing in aerobic bottle as well okay to cover with zosyn until we know the ID and then can de-escalate to unasyn  Hepc - as per care everywhere notes failed PEG/ribavarin in 2012. Plan was to treat her with sofosbuvir but dont see any notes to suggest that  H/o substance use in the past Dont know her current status Check urine tox screen  ? ? ___________________________________________________ Discussed with  requesting provider Note:  This document was prepared using Dragon voice recognition software and may include unintentional dictation errors.

## 2022-08-25 NOTE — Progress Notes (Signed)
Initial Nutrition Assessment  DOCUMENTATION CODES:   Obesity unspecified  INTERVENTION:   Vital 1.2@50ml /hr- Initiate at 44ml/hr and increase by 9ml/hr q 8 hours until goal rate is reached.   ProSource TF 20- Give 60ml daily via tube, each supplement provides 80kcal and 20g of protein.   Free water flushes 30ml q4 hours to maintain tube patency   Regimen provides 1520kcal/day, 110g/day protein and 1139ml/day of free water.   Pt at refeed risk; recommend monitor potassium, magnesium and phosphorus labs daily until stable  Daily weights   NUTRITION DIAGNOSIS:   Inadequate oral intake related to inability to eat (pt sedated and ventilated) as evidenced by NPO status.  GOAL:   Provide needs based on ASPEN/SCCM guidelines  MONITOR:   Vent status, Labs, Weight trends, TF tolerance, Skin, I & O's  REASON FOR ASSESSMENT:   Ventilator    ASSESSMENT:   62 y.o. female with h/o chronic pain, depression, COPD, hepatitis C and cirrhosis who presents with right sided laryngeal and right submandibular edema concerning for possible Ludwig's Angina, requiring Nasotracheal intubation by ENT for airway protection  Pt sedated and ventilated. OGT in place. Will plan to initiate tube feeds today. There is no weight history in chart to determine if pt has had any significant weight changes pta. Pt does appear to be up ~18lbs from admission. Pt +3.4L on her I & Os.   Medications reviewed and include: dexamethasone, colace, lovenox, insulin, miralax, pepcid, LRS @75ml /hr, levophed, zosyn, propofol  Labs reviewed: Na 134(L), K 3.8 wnl, P 2.8 wnl, Mg 2.0 wnl Wbc- 15.0(H) Cbgs- 125, 130, 126 x 24 hrs   Patient is currently intubated on ventilator support MV: 7.3 L/min Temp (24hrs), Avg:97.7 F (36.5 C), Min:96.4 F (35.8 C), Max:99 F (37.2 C)  Propofol: 7.16 ml/hr- provides 189kcal/day  MAP- >43mmHg   UOP-   NUTRITION - FOCUSED PHYSICAL EXAM:  Flowsheet Row Most Recent  Value  Orbital Region No depletion  Upper Arm Region No depletion  Thoracic and Lumbar Region No depletion  Buccal Region No depletion  Temple Region No depletion  Clavicle Bone Region No depletion  Clavicle and Acromion Bone Region No depletion  Scapular Bone Region No depletion  Dorsal Hand Unable to assess  Patellar Region Moderate depletion  Anterior Thigh Region Moderate depletion  Posterior Calf Region Moderate depletion  Edema (RD Assessment) Mild  Hair Reviewed  Eyes Reviewed  Mouth Reviewed  Skin Reviewed  Nails Reviewed   Diet Order:   Diet Order             Diet NPO time specified  Diet effective now                  EDUCATION NEEDS:   No education needs have been identified at this time  Skin:  Skin Assessment: Reviewed RN Assessment (incision nose, ecchymosis)  Last BM:  pta  Height:   Ht Readings from Last 1 Encounters:  08/24/22 5\' 2"  (1.575 m)    Weight:   Wt Readings from Last 1 Encounters:  08/25/22 87.5 kg    Ideal Body Weight:  50 kg  BMI:  Body mass index is 35.28 kg/m.  Estimated Nutritional Needs:   Kcal:  1491kcal/day  Protein:  >100g/day  Fluid:  1.5-1.7L/day  Betsey Holiday MS, RD, LDN Please refer to [facility] for RD and/or RD on-call/weekend/after hours pager

## 2022-08-25 NOTE — Progress Notes (Signed)
NAME:  Sierra Patterson, MRN:  [id number], DOB:  1961-01-05, LOS: 1 ADMISSION DATE:  08/24/2022, CONSULTATION DATE:  08/24/2022 REFERRING MD:  Dr. Rosalia Hammers, CHIEF COMPLAINT:  Facial swelling   Brief Pt Description / Synopsis:  62 y.o. 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  History of Present Illness:  Sierra Patterson is a 63 year old female with a past medical history significant for COPD, hypertension, cirrhosis due to hepatitis C who presents to [facility] ED on 08/24/2022 due to complaints of right-sided facial swelling.  Patient is currently intubated and sedated and no family is currently available, therefore history is obtained from chart review.  Per ED and nursing notes the patient was working outside in her yard yesterday evening.  She did not notice any bug bites or bee stings, however yesterday been began to notice some swelling over the right side of her face with some neck soreness.  This morning the swelling worsened prompting her to seek evaluation in the ED.  She reported that is now becoming difficult to swallow and feels as if her throat is swelling and starting to close then.  She denied any ACE inhibitor use, fever, chills, rash, chest pain, shortness of breath, vomiting, nausea, vomiting, diarrhea, abdominal pain, dysuria.  ED Course: Initial Vital Signs: Temperature 100.1 F orally, respiratory rate 24, pulse 107, blood pressure 192/98, SpO2 95% on room air Significant Labs: Sodium 130, potassium 3.4, glucose 127, total bilirubin 2.3, lactic acid 1.4, procalcitonin 0.53, WBC 12.1 with neutrophilia COVID-19 PCR is negative Imaging CT soft tissue of the Neck>>IMPRESSION: Supraglottic laryngeal edema worse towards the right with airway narrowing. There is also notable right submandibular space and gland inflammation, the primary infectious/inflammatory  cause is uncertain. No collection. Medications Administered: IV Unasyn, 10 mg Decadron, 1 L LR  On exam she was noted to have swelling along the right lower face into the submandibular area with tenderness to palpation, along with some uvular swelling and asymmetric edema on the right side of the posterior oropharynx.  She did not exhibit any stridor or wheezing, but her voice was hoarse and somewhat muffled, she was able to manage secretions without issue.  She was evaluated by ENT who recommended intubating in the OR for airway protection.  PCCM is asked to admit for further workup and treatment postintubation.  Please see "significant hospital events" section below for full detailed hospital course.  Pertinent  Medical History   Past Medical History:  Diagnosis Date   Cirrhosis (HCC)     Micro Data:  5/27: Blood culture x2>> 4/4 bottles with gram - rods 5/27: COVID-19/RSV/FLU PCR>>negative 5/27: RVP>> negative 5/27: Group A Strep PCR>>negative 5/27: Strep pneumo urinary antigen>>negative   Antimicrobials:   Anti-infectives (From admission, onward)    Start     Dose/Rate Route Frequency Ordered Stop   08/24/22 1800  Ampicillin-Sulbactam (UNASYN) 3 g in sodium chloride 0.9 % 100 mL IVPB        3 g 200 mL/hr over 30 Minutes Intravenous Every 6 hours 08/24/22 1033     08/24/22 0915  Ampicillin-Sulbactam (UNASYN) 3 g in sodium chloride 0.9 % 100 mL IVPB        3 g 200 mL/hr over 30 Minutes Intravenous  Once 08/24/22 0914 08/24/22 1017        Significant Hospital Events: Including procedures, antibiotic start and stop dates in addition to other pertinent events  5/27: Presents to ED with right sided facial and throat swelling.  ENT intubated in OR for airway protection.   PCCM asked to admit 5/28: Blood cultures overnight with Gram - rods.  Will change ABX to Zosyn and consult ID. Obtain Echocardiogram.  Requiring low dose pressors (Levophed 2 mcg).  Interim History /  Subjective:  -No significant events noted overnight -Blood cultures (4/4 bottles) with gram - rods ~ will change to Zosyn and consult ID  -Afebrile, requiring low dose pressors (2 mcg Levo) -On minimal vent support ~ no SBT today as ENT recommends 48 hrs of IV ABX and steroids -Na improved to 134 from 130 with IVF -Pt arouses to voice and follows simple commands but appears anxious ~ will resume home Clonazepam  Objective   Blood pressure 116/73, pulse (!) 55, temperature (!) 97.2 F (36.2 C), resp. rate 16, height 5\' 2"  (1.575 m), weight 87.5 kg, SpO2 96 %.    Vent Mode: PRVC FiO2 (%):  [28 %-40 %] 28 % Set Rate:  [16 bmp] 16 bmp Vt Set:  [450 mL] 450 mL PEEP:  [5 cmH20] 5 cmH20 Plateau Pressure:  [16 cmH20-19 cmH20] 16 cmH20   Intake/Output Summary (Last 24 hours) at 08/25/2022 0758 Last data filed at 08/25/2022 0600 Gross per 24 hour  Intake 4492.78 ml  Output 1325 ml  Net 3167.78 ml   Filed Weights   08/24/22 0759 08/24/22 1100 08/25/22 0500  Weight: 79.6 kg 81.1 kg 87.5 kg    Examination: General: Acute on chronically ill appearing female, laying in bed, intubated and sedated, in NAD HENT: Atraumatic, normocephalic, neck supple, no JVD Lungs: Mechanical breath throughout, even, synchronous with the vent Cardiovascular: Bradycardia, Regular Rhythm, s1s2, no M/R/G Abdomen: Soft, nontender, nondistended, no guarding or rebound tenderness, BS+ x4 Extremities: Normal bulk and tone, no deformities, no edema Neuro: Lightly sedated, arouses to gentle stimulation/voice and follows commands, no focal deficits noted, pupils PERRL GU: Foley catheter in place draining yellow urine  Resolved Hospital Problem list     Assessment & Plan:   #Intubated for airway protection in setting of right sided Laryngeal and Submandibular space Edema #COPD without acute exacerbation -Full vent support, implement lung protective strategies -Plateau pressures less than 30 cm H20 -Wean FiO2 &  PEEP as tolerated to maintain O2 sats 88 to 92% -Follow intermittent Chest X-ray & ABG as needed -Spontaneous Breathing Trials when respiratory parameters met, mental status permits, & and with + cuff leak -Implement VAP Bundle -Bronchodilators  #Right sided Laryngeal and Submandibular space Edema, 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 No reported bee sting or bug bites and no associated hives or hypotension to suggest anaphylaxis rxn, does not take ACE-i CT Neck on presentation with supraglottic laryngeal edema (worse on right) with airway narrowing and notable right submandibular space and gland inflammation (but NO abscess collection and uncertain primary infectious/inflammatory etiology) -ENT following, appreciate input ~ recommends 48 hrs of ABX & Steroids, and then repeating CT scan at that time -IV Decadron 10 mg q8h -IV Pepcid and Benadryl -Check CRP and Complement C4  #Meets SIRS Criteria on presentation (HR >100, RR 22, WBC 12.1) #Severe Sepsis due to Gram Negative Rod BACTEREMIA from suspected Submandibular/Peritonsillar Infection & Ludwig's Angina -Monitor fever curve -Trend WBC's & Procalcitonin -Follow cultures as above -Start empiric Zosyn pending cultures & sensitivities -Consult ID, appreciate input -Obtain Echocardiogram  #Hypotension: Septic shock + sedation related PMHx: HTN -Continuous cardiac monitoring -Maintain MAP >65 -IV fluids -Vasopressors as needed to maintain MAP  goal -Lactic acid is normal (1.4) -Obtain Echocardiogram  -Hold home metoprolol  #Mild Hyponatremia ~ IMPROVED #Mild Hypokalemia ~ RESOLVED -Monitor I&O's / urinary output -Follow BMP -Ensure adequate renal perfusion -Avoid nephrotoxic agents as able -Replace electrolytes as indicated ~ Pharmacy following for assistance with electrolyte replacement -IV fluids  #Chronic Thrombocytopenia due to Cirrhosis -Monitor for S/Sx of bleeding -Trend CBC -Lovenox for VTE Prophylaxis  -Transfuse for Hgb  <7 -Transfuse platelets for platelet count <50K with active bleeding  #Hyperglycemia due to critical illness exacerbated by high dose steroids -CBG's q4h; Target range of 140 to 180 -SSI -Follow ICU Hypo/Hyperglycemia protocol -Hgb A1c is pending  #Sedation needs in the setting of mechanical ventilation -Maintain a RASS goal of -1 to -2 (higher RASS given need to ensure airway protected/maintained) -Fentanyl and Propofol to maintain RASS goal -Avoid sedating medications as able -Daily wake up assessment -Resume home Clonazepam     Best Practice (right click and "Reselect all SmartList Selections" daily)   Diet/type: NPO, consult Dietician for TF's DVT prophylaxis: LMWH GI prophylaxis: H2B Lines: N/A Foley:  yes, and is still needed Code Status:  full code Last date of multidisciplinary goals of care discussion [5/28]  5/28: Will update pt's family when they arrive at bedside.  Labs   CBC: Recent Labs  Lab 08/24/22 0810 08/24/22 1122 08/25/22 0547  WBC 12.1* 13.2* 15.0*  NEUTROABS 10.6*  --   --   HGB 15.6* 16.4* 13.3  HCT 43.9 45.9 36.6  MCV 91.3 90.9 91.3  PLT PLATELET CLUMPS NOTED ON SMEAR, UNABLE TO ESTIMATE 98* 110*     Basic Metabolic Panel: Recent Labs  Lab 08/24/22 0810 08/25/22 0547  NA 130* 134*  K 3.4* 3.7  CL 99 101  CO2 22 23  GLUCOSE 127* 137*  BUN 11 21  CREATININE 0.78 0.81  CALCIUM 8.8* 8.5*  MG  --  1.8  PHOS  --  2.8    GFR: Estimated Creatinine Clearance: 75 mL/min (by C-G formula based on SCr of 0.81 mg/dL). Recent Labs  Lab 08/24/22 0810 08/24/22 1122 08/25/22 0547  PROCALCITON 0.53  --   --   WBC 12.1* 13.2* 15.0*  LATICACIDVEN 1.4  --   --      Liver Function Tests: Recent Labs  Lab 08/24/22 0810 08/25/22 0547  AST 31  --   ALT 21  --   ALKPHOS 67  --   BILITOT 2.3*  --   PROT 9.0*  --   ALBUMIN 3.9 2.8*    No results for input(s): "LIPASE", "AMYLASE" in the last 168 hours. No results for input(s):  "AMMONIA" in the last 168 hours.  ABG    Component Value Date/Time   PHART 7.37 08/24/2022 1155   PCO2ART 39 08/24/2022 1155   PO2ART 107 08/24/2022 1155   HCO3 22.5 08/24/2022 1155   ACIDBASEDEF 2.5 (H) 08/24/2022 1155   O2SAT 99.1 08/24/2022 1155     Coagulation Profile: Recent Labs  Lab 08/24/22 0810  INR 1.1     Cardiac Enzymes: No results for input(s): "CKTOTAL", "CKMB", "CKMBINDEX", "TROPONINI" in the last 168 hours.  HbA1C: No results found for: "HGBA1C"  CBG: Recent Labs  Lab 08/24/22 1634 08/24/22 1942 08/24/22 2301 08/25/22 0309 08/25/22 0716  GLUCAP 133* 131* 141* 126* 130*    Review of Systems:   Unable to assess due to intubation/sedation/critical illness   Past Medical History:  She,  has a past medical history of Cirrhosis (HCC).   Surgical  History:  History reviewed. No pertinent surgical history.   Social History:   reports that she has been smoking cigarettes. She has been smoking an average of .5 packs per day. Her smokeless tobacco use includes snuff. She reports current alcohol use. She reports current drug use.   Family History:  Her family history is not on file.   Allergies Allergies  Allergen Reactions   Bee Venom Anaphylaxis and Swelling   Ibuprofen     REACTION: sick on stomach     Home Medications  Prior to Admission medications   Medication Sig Start Date End Date Taking? Authorizing Provider  clonazePAM (KLONOPIN) 0.5 MG tablet Take 1 tablet by mouth 2 (two) times daily. 08/23/12   [provider]  furosemide (LASIX) 20 MG tablet Take 1 tablet by mouth 2 (two) times daily. 11/25/12   [provider]  gabapentin (NEURONTIN) 300 MG capsule Take 1 capsule by mouth 3 (three) times daily. 08/23/12   [provider]  metoprolol tartrate (LOPRESSOR) 50 MG tablet Take 1 tablet by mouth 2 (two) times daily.  08/23/12   [provider]  naloxone [facility]) nasal spray 4 mg/0.1 mL Administer 1 spray  as needed into one nostril in the case of decreased breathing and responsiveness thought to be due to narcotics overdose.  Call 911 and repeat dose every 2 to 3 minutes in alternating nostrils until EMS arrives. 11/18/16   Loleta Rose, MD  OMEPRAZOLE PO Take 1 capsule by mouth 2 (two) times daily.    [provider]  SM MULTIPLE VITAMINS/IRON TABS Take 1 tablet by mouth daily. 08/08/10   [provider]     Critical care time: 40 minutes     Harlon Ditty, AGACNP-BC [facility] Prefer epic messenger for cross cover needs If after hours, please call E-link

## 2022-08-25 NOTE — Progress Notes (Signed)
08/25/2022 4:58 PM  Sierra Patterson [id number]  Post-Op Day 1    Temp:  [96.4 F (35.8 C)-98.8 F (37.1 C)] 97.2 F (36.2 C) (05/28 1600) Pulse Rate:  [45-101] 54 (05/28 1600) Resp:  [14-23] 16 (05/28 1600) BP: (71-143)/(52-94) 111/75 (05/28 1600) SpO2:  [96 %-100 %] 96 % (05/28 1600) FiO2 (%):  [28 %-40 %] 28 % (05/28 1200) Weight:  [87.5 kg] 87.5 kg (05/28 0500),     Intake/Output Summary (Last 24 hours) at 08/25/2022 1658 Last data filed at 08/25/2022 1600 Gross per 24 hour  Intake 3994.95 ml  Output 1137 ml  Net 2857.95 ml    Results for orders placed or performed during the hospital encounter of 08/24/22 (from the past 24 hour(s))  Glucose, capillary     Status: Abnormal   Collection Time: 08/24/22  7:42 PM  Result Value Ref Range   Glucose-Capillary 131 (H) 70 - 99 mg/dL  Glucose, capillary     Status: Abnormal   Collection Time: 08/24/22 11:01 PM  Result Value Ref Range   Glucose-Capillary 141 (H) 70 - 99 mg/dL  Glucose, capillary     Status: Abnormal   Collection Time: 08/25/22  3:09 AM  Result Value Ref Range   Glucose-Capillary 126 (H) 70 - 99 mg/dL  Procalcitonin     Status: None   Collection Time: 08/25/22  5:46 AM  Result Value Ref Range   Procalcitonin 5.40 ng/mL  CBC     Status: Abnormal   Collection Time: 08/25/22  5:47 AM  Result Value Ref Range   WBC 15.0 (H) 4.0 - 10.5 K/uL   RBC 4.01 3.87 - 5.11 MIL/uL   Hemoglobin 13.3 12.0 - 15.0 g/dL   HCT 57.8 46.9 - 62.9 %   MCV 91.3 80.0 - 100.0 fL   MCH 33.2 26.0 - 34.0 pg   MCHC 36.3 (H) 30.0 - 36.0 g/dL   RDW 52.8 41.3 - 24.4 %   Platelets 110 (L) 150 - 400 K/uL   nRBC 0.0 0.0 - 0.2 %  Renal function panel     Status: Abnormal   Collection Time: 08/25/22  5:47 AM  Result Value Ref Range   Sodium 134 (L) 135 - 145 mmol/L   Potassium 3.7 3.5 - 5.1 mmol/L   Chloride 101 98 - 111 mmol/L   CO2 23 22 - 32 mmol/L   Glucose, Bld 137 (H) 70 - 99 mg/dL   BUN 21 8 - 23 mg/dL   Creatinine, Ser 0.10 0.44  - 1.00 mg/dL   Calcium 8.5 (L) 8.9 - 10.3 mg/dL   Phosphorus 2.8 2.5 - 4.6 mg/dL   Albumin 2.8 (L) 3.5 - 5.0 g/dL   GFR, Estimated >27 >25 mL/min   Anion gap 10 5 - 15  Magnesium     Status: None   Collection Time: 08/25/22  5:47 AM  Result Value Ref Range   Magnesium 1.8 1.7 - 2.4 mg/dL  Triglycerides     Status: None   Collection Time: 08/25/22  5:47 AM  Result Value Ref Range   Triglycerides 48 <150 mg/dL  Glucose, capillary     Status: Abnormal   Collection Time: 08/25/22  7:16 AM  Result Value Ref Range   Glucose-Capillary 130 (H) 70 - 99 mg/dL  Potassium     Status: None   Collection Time: 08/25/22 10:17 AM  Result Value Ref Range   Potassium 3.8 3.5 - 5.1 mmol/L  Magnesium     Status: None  Collection Time: 08/25/22 10:17 AM  Result Value Ref Range   Magnesium 2.0 1.7 - 2.4 mg/dL  Troponin I (High Sensitivity)     Status: Abnormal   Collection Time: 08/25/22 10:17 AM  Result Value Ref Range   Troponin I (High Sensitivity) 29 (H) <18 ng/L  Glucose, capillary     Status: Abnormal   Collection Time: 08/25/22 11:17 AM  Result Value Ref Range   Glucose-Capillary 125 (H) 70 - 99 mg/dL  Troponin I (High Sensitivity)     Status: Abnormal   Collection Time: 08/25/22 12:16 PM  Result Value Ref Range   Troponin I (High Sensitivity) 24 (H) <18 ng/L  Glucose, capillary     Status: Abnormal   Collection Time: 08/25/22  3:25 PM  Result Value Ref Range   Glucose-Capillary 117 (H) 70 - 99 mg/dL    SUBJECTIVE: Patient intubated and sedated  OBJECTIVE: Patient is a nasotracheal tube in position and apparently an orogastric tube, this limits examination of the oropharynx but examination of floor mouth there continues to be edema and erythema in the anterior floor mouth space.  IMPRESSION: Supra glottitis with Ludwig's angina-floor mouth swelling has not significantly decreased at this point.  Patient is currently on Zosyn and Decadron.  Recommend continuing this.  If the  orogastric tube could be changed to a nasogastric tube this could improve our ability to see her oropharynx particularly the uvular swelling.  PLAN: Supraglottitis with Ludwig angina requiring intubation.  Airway appears to be stable.  The anterior floor mouth continues to have swelling and erythema.  If the orogastric tube could be changed to a nasogastric tube this would allow easier access to the oropharynx to examine the uvula to assess swelling.  There is no reason that a catheter could not be placed through the left nose into the stomach.  I will continue to follow-up with the patient.  Will continue with IV antibiotics and steroids till swelling has improved.  [name] 08/25/2022, 4:58 PM

## 2022-08-25 NOTE — Progress Notes (Signed)
Updated pt's brother Chase Caller at bedside.  All questions answered.    Harlon Ditty, AGACNP-BC [facility] Prefer epic messenger for cross cover needs If after hours, please call E-link

## 2022-08-25 NOTE — Plan of Care (Signed)
  Problem: Education: Goal: Knowledge of General Education information will improve Description: Including pain rating scale, medication(s)/side effects and non-pharmacologic comfort measures Outcome: Not Progressing   Problem: Health Behavior/Discharge Planning: Goal: Ability to manage health-related needs will improve Outcome: Not Progressing   Problem: Clinical Measurements: Goal: Ability to maintain clinical measurements within normal limits will improve Outcome: Not Progressing Goal: Will remain free from infection Outcome: Not Progressing Goal: Diagnostic test results will improve Outcome: Not Progressing Goal: Respiratory complications will improve Outcome: Not Progressing Goal: Cardiovascular complication will be avoided Outcome: Not Progressing   Problem: Activity: Goal: Risk for activity intolerance will decrease Outcome: Not Progressing   Problem: Nutrition: Goal: Adequate nutrition will be maintained Outcome: Not Progressing   Problem: Coping: Goal: Level of anxiety will decrease Outcome: Not Progressing   Problem: Elimination: Goal: Will not experience complications related to bowel motility Outcome: Not Progressing Goal: Will not experience complications related to urinary retention Outcome: Not Progressing   Problem: Pain Managment: Goal: General experience of comfort will improve Outcome: Not Progressing   Problem: Safety: Goal: Ability to remain free from injury will improve Outcome: Not Progressing   Problem: Skin Integrity: Goal: Risk for impaired skin integrity will decrease Outcome: Not Progressing   Problem: Activity: Goal: Ability to tolerate increased activity will improve Outcome: Not Progressing   Problem: Respiratory: Goal: Ability to maintain a clear airway and adequate ventilation will improve Outcome: Not Progressing   Problem: Role Relationship: Goal: Method of communication will improve Outcome: Not Progressing   Problem:  Education: Goal: Ability to describe self-care measures that may prevent or decrease complications (Diabetes Survival Skills Education) will improve Outcome: Not Progressing Goal: Individualized Educational Video(s) Outcome: Not Progressing   Problem: Coping: Goal: Ability to adjust to condition or change in health will improve Outcome: Not Progressing   Problem: Fluid Volume: Goal: Ability to maintain a balanced intake and output will improve Outcome: Not Progressing   Problem: Health Behavior/Discharge Planning: Goal: Ability to identify and utilize available resources and services will improve Outcome: Not Progressing Goal: Ability to manage health-related needs will improve Outcome: Not Progressing   Problem: Metabolic: Goal: Ability to maintain appropriate glucose levels will improve Outcome: Not Progressing   Problem: Nutritional: Goal: Maintenance of adequate nutrition will improve Outcome: Not Progressing Goal: Progress toward achieving an optimal weight will improve Outcome: Not Progressing   Problem: Skin Integrity: Goal: Risk for impaired skin integrity will decrease Outcome: Not Progressing   Problem: Tissue Perfusion: Goal: Adequacy of tissue perfusion will improve Outcome: Not Progressing   

## 2022-08-25 NOTE — Progress Notes (Signed)
*  PRELIMINARY RESULTS* Echocardiogram 2D Echocardiogram has been performed.  Carolyne Fiscal 08/25/2022, 3:47 PM

## 2022-08-25 NOTE — Consult Note (Signed)
PHARMACY CONSULT NOTE - FOLLOW UP  Pharmacy Consult for Electrolyte Monitoring and Replacement   Recent Labs: Potassium (mmol/L)  Date Value  08/25/2022 3.7  01/10/2014 3.7   Magnesium (mg/dL)  Date Value  08/65/7846 1.8   Calcium (mg/dL)  Date Value  96/29/5284 8.5 (L)   Calcium, Total (mg/dL)  Date Value  13/24/4010 8.3 (L)   Albumin (g/dL)  Date Value  27/25/3664 2.8 (L)  01/10/2014 3.4   Phosphorus (mg/dL)  Date Value  40/34/7425 2.8   Sodium (mmol/L)  Date Value  08/25/2022 134 (L)  01/10/2014 142     Assessment: Patient presented with complaint of right sided lower jaw swelling. Side of face and neck are swollen.    Goal of Therapy:  WNL  Plan:  No current replacement indicated F/u with AM labs.   Bettey Costa ,PharmD Clinical Pharmacist 08/25/2022 7:31 AM

## 2022-08-26 ENCOUNTER — Encounter: Payer: Self-pay | Admitting: Unknown Physician Specialty

## 2022-08-26 ENCOUNTER — Inpatient Hospital Stay: Payer: Medicare Other

## 2022-08-26 DIAGNOSIS — A280 Pasteurellosis: Secondary | ICD-10-CM

## 2022-08-26 LAB — CBC

## 2022-08-26 LAB — CBC WITH DIFFERENTIAL/PLATELET
Abs Immature Granulocytes: 0.02 10*3/uL (ref 0.00–0.07)
Basophils Absolute: 0 10*3/uL (ref 0.0–0.1)
Basophils Relative: 0 %
Eosinophils Absolute: 0 10*3/uL (ref 0.0–0.5)
Eosinophils Relative: 0 %
HCT: 32.3 % — ABNORMAL LOW (ref 36.0–46.0)
Hemoglobin: 11.7 g/dL — ABNORMAL LOW (ref 12.0–15.0)
Immature Granulocytes: 0 %
Lymphocytes Relative: 12 %
Lymphs Abs: 0.5 10*3/uL — ABNORMAL LOW (ref 0.7–4.0)
MCH: 33.1 pg (ref 26.0–34.0)
MCHC: 36.2 g/dL — ABNORMAL HIGH (ref 30.0–36.0)
MCV: 91.5 fL (ref 80.0–100.0)
Monocytes Absolute: 0.2 10*3/uL (ref 0.1–1.0)
Monocytes Relative: 5 %
Neutro Abs: 3.7 10*3/uL (ref 1.7–7.7)
Neutrophils Relative %: 83 %
Platelets: 85 10*3/uL — ABNORMAL LOW (ref 150–400)
RBC: 3.53 MIL/uL — ABNORMAL LOW (ref 3.87–5.11)
RDW: 14 % (ref 11.5–15.5)
WBC: 4.5 10*3/uL (ref 4.0–10.5)
nRBC: 0 % (ref 0.0–0.2)

## 2022-08-26 LAB — GLUCOSE, CAPILLARY
Glucose-Capillary: 117 mg/dL — ABNORMAL HIGH (ref 70–99)
Glucose-Capillary: 126 mg/dL — ABNORMAL HIGH (ref 70–99)
Glucose-Capillary: 127 mg/dL — ABNORMAL HIGH (ref 70–99)
Glucose-Capillary: 136 mg/dL — ABNORMAL HIGH (ref 70–99)
Glucose-Capillary: 137 mg/dL — ABNORMAL HIGH (ref 70–99)
Glucose-Capillary: 137 mg/dL — ABNORMAL HIGH (ref 70–99)
Glucose-Capillary: 142 mg/dL — ABNORMAL HIGH (ref 70–99)

## 2022-08-26 LAB — RENAL FUNCTION PANEL
Albumin: 2.3 g/dL — ABNORMAL LOW (ref 3.5–5.0)
Anion gap: 9 (ref 5–15)
BUN: 36 mg/dL — ABNORMAL HIGH (ref 8–23)
CO2: 22 mmol/L (ref 22–32)
Calcium: 8 mg/dL — ABNORMAL LOW (ref 8.9–10.3)
Chloride: 104 mmol/L (ref 98–111)
Creatinine, Ser: 0.92 mg/dL (ref 0.44–1.00)
GFR, Estimated: >60 mL/min (ref >60)
Glucose, Bld: 126 mg/dL — ABNORMAL HIGH (ref 70–99)
Phosphorus: 4 mg/dL (ref 2.5–4.6)
Potassium: 3.8 mmol/L (ref 3.5–5.1)
Sodium: 135 mmol/L (ref 135–145)

## 2022-08-26 LAB — THYROID PANEL WITH TSH
Free Thyroxine Index: 3 (ref 1.2–4.9)
T3 Uptake Ratio: 36 % (ref 24–39)
T4, Total: 8.4 ug/dL (ref 4.5–12.0)
TSH: 0.309 u[IU]/mL — ABNORMAL LOW (ref 0.450–4.500)

## 2022-08-26 LAB — MAGNESIUM: Magnesium: 2.4 mg/dL (ref 1.7–2.4)

## 2022-08-26 LAB — PROCALCITONIN: Procalcitonin: 4.67 ng/mL

## 2022-08-26 LAB — CULTURE, BLOOD (ROUTINE X 2)

## 2022-08-26 LAB — C-REACTIVE PROTEIN: CRP: 10.3 mg/dL — ABNORMAL HIGH (ref <1.0)

## 2022-08-26 MED ORDER — IPRATROPIUM-ALBUTEROL 0.5-2.5 (3) MG/3ML IN SOLN
3.0000 mL | Freq: Once | RESPIRATORY_TRACT | Status: AC
  Administered 2022-08-26: 3 mL via RESPIRATORY_TRACT
  Filled 2022-08-26: qty 3

## 2022-08-26 NOTE — Progress Notes (Signed)
Date of Admission:  08/24/2022    ID: Sierra Patterson is a 62 y.o. female Principal Problem:   Laryngeal edema Active Problems:   Acute hypoxic respiratory failure (HCC)   Ludwig's angina   Sepsis (HCC)   Gram-negative bacteremia    Subjective: Remains intubated and sedated  Medications:   Chlorhexidine Gluconate Cloth  6 each Topical Nightly   clonazepam  0.5 mg Per Tube BID   dexamethasone (DECADRON) injection  10 mg Intravenous Q8H   docusate  100 mg Per Tube BID   enoxaparin (LOVENOX) injection  40 mg Subcutaneous Q24H   feeding supplement (PROSource TF20)  60 mL Per Tube Daily   free water  30 mL Per Tube Q4H   insulin aspart  0-15 Units Subcutaneous Q4H   polyethylene glycol  17 g Per Tube Daily    Objective: Vital signs in last 24 hours: Patient Vitals for the past 24 hrs:  BP Temp Temp src Pulse Resp SpO2 Weight  08/26/22 1000 102/72 -- -- (!) 49 17 97 % --  08/26/22 0930 103/67 -- -- 66 16 97 % --  08/26/22 0900 94/64 -- -- (!) 49 16 97 % --  08/26/22 0830 106/75 -- -- -- -- -- --  08/26/22 0800 (!) 90/58 97.9 F (36.6 C) Bladder (!) 50 16 97 % --  08/26/22 0600 (!) 90/58 97.9 F (36.6 C) -- (!) 50 16 98 % --  08/26/22 0542 -- 99 F (37.2 C) Oral -- -- -- --  08/26/22 0500 (!) 83/52 (!) 97.5 F (36.4 C) -- (!) 52 16 97 % --  08/26/22 0448 -- -- -- -- -- -- 85.9 kg  08/26/22 0400 (!) 85/54 (!) 97 F (36.1 C) -- (!) 44 16 97 % --  08/26/22 0300 90/60 (!) 96.8 F (36 C) -- (!) 49 16 98 % --  08/26/22 0200 (!) 89/60 98.3 F (36.8 C) Oral (!) 50 16 97 % --  08/26/22 0100 92/64 (!) 95.9 F (35.5 C) -- (!) 51 16 97 % --  08/26/22 0000 95/66 (!) 95.9 F (35.5 C) -- (!) 47 18 99 % --  08/25/22 2306 -- (!) 96.9 F (36.1 C) Axillary -- -- -- --  08/25/22 2300 95/67 (!) 96.3 F (35.7 C) -- (!) 47 16 99 % --  08/25/22 2200 103/68 (!) 96.8 F (36 C) -- (!) 53 16 99 % --  08/25/22 2100 (!) 84/57 (!) 97 F (36.1 C) -- (!) 51 16 98 % --  08/25/22 2000 (!) 94/57  (!) 97.3 F (36.3 C) -- (!) 46 14 98 % --  08/25/22 1948 -- -- -- -- -- 98 % --  08/25/22 1943 -- 98.7 F (37.1 C) Oral -- -- -- --  08/25/22 1900 101/70 (!) 97.2 F (36.2 C) -- 60 15 97 % --  08/25/22 1830 (!) 147/100 (!) 97.2 F (36.2 C) -- 80 14 99 % --  08/25/22 1800 132/86 (!) 97.2 F (36.2 C) -- (!) 51 19 98 % --  08/25/22 1730 (!) 147/80 (!) 97 F (36.1 C) -- (!) 55 18 98 % --  08/25/22 1715 137/76 (!) 97.2 F (36.2 C) -- (!) 54 14 99 % --  08/25/22 1600 111/75 (!) 97.2 F (36.2 C) -- (!) 54 16 96 % --  08/25/22 1530 136/85 (!) 96.8 F (36 C) -- (!) 56 16 96 % --  08/25/22 1500 132/84 (!) 96.8 F (36 C) -- (!) 54 15 96 % --  08/25/22 1430 126/79 (!) 96.6 F (35.9 C) -- (!) 51 18 98 % --  08/25/22 1400 116/68 (!) 96.6 F (35.9 C) -- (!) 52 16 98 % --  08/25/22 1330 (!) 82/53 (!) 96.6 F (35.9 C) -- (!) 59 18 96 % --  08/25/22 1315 (!) 87/57 (!) 96.6 F (35.9 C) -- 60 16 97 % --  08/25/22 1300 (!) 84/56 (!) 96.8 F (36 C) -- 60 17 96 % --  08/25/22 1230 93/63 (!) 96.8 F (36 C) -- (!) 58 16 96 % --  08/25/22 1200 129/80 97.9 F (36.6 C) Axillary (!) 51 18 96 % --     PHYSICAL EXAM:  General: nasally intubated sedated.  Head: Normocephalic, without obvious abnormality, atraumatic. Eyes: Conjunctivae clear, anicteric sclerae. Pupils are equal ENT cannot assess Neck: swelling less Lungs: b/l air entry Heart: bradycardia. Abdomen: Soft, non-tender,not distended. Bowel sounds normal. No masses Extremities: spider veins over legs Skin: scabbed excoriation/superficial scratch No wounds No injuries Neurologic: cannot assess  Lab Results    Latest Ref Rng & Units 08/26/2022    7:24 AM 08/25/2022    5:47 AM 08/24/2022   11:22 AM  CBC  WBC 4.0 - 10.5 K/uL QUESTIONABLE RESULTS, RECOMMEND RECOLLECT TO VERIFY  C 15.0  13.2   Hemoglobin 12.0 - 15.0 g/dL QUESTIONABLE RESULTS, RECOMMEND RECOLLECT TO VERIFY  C 13.3  16.4   Hematocrit 36.0 - 46.0 % QUESTIONABLE RESULTS,  RECOMMEND RECOLLECT TO VERIFY  C 36.6  45.9   Platelets 150 - 400 K/uL QUESTIONABLE RESULTS, RECOMMEND RECOLLECT TO VERIFY  C 110  98     C Corrected result       Latest Ref Rng & Units 08/26/2022    7:24 AM 08/25/2022   10:17 AM 08/25/2022    5:47 AM  CMP  Glucose 70 - 99 mg/dL 161   096   BUN 8 - 23 mg/dL 36   21   Creatinine 0.45 - 1.00 mg/dL 4.09   8.11   Sodium 914 - 145 mmol/L 135   134   Potassium 3.5 - 5.1 mmol/L 3.8  3.8  3.7   Chloride 98 - 111 mmol/L 104   101   CO2 22 - 32 mmol/L 22   23   Calcium 8.9 - 10.3 mg/dL 8.0   8.5       Microbiology: BC x4 pasteurella  Studies/Results: DG Chest Port 1 View  Result Date: 08/26/2022 CLINICAL DATA:  Acute respiratory failure EXAM: PORTABLE CHEST 1 VIEW COMPARISON:  Chest x-ray dated Aug 25, 2022 FINDINGS: Interval retraction of ETT with tip 2 cm from the carina. Enteric tube partially visualized coursing below the diaphragm. Heart size and mediastinal contours are within normal limits. Unchanged linear retrocardiac opacity of the left lung base, likely atelectasis. Lungs are otherwise clear. No pleural effusion or pneumothorax. IMPRESSION: 1. Interval retraction of ETT with tip 2 cm from the carina. 2. Unchanged left basilar atelectasis. Electronically Signed   By: Allegra Lai M.D.   On: 08/26/2022 08:51   DG Abd 1 View  Result Date: 08/25/2022 CLINICAL DATA:  Orogastric tube placement. EXAM: ABDOMEN - 1 VIEW COMPARISON:  Aug 24, 2022 FINDINGS: An orogastric tube is seen with its distal tip near the expected region of the gastric antrum. Its distal side hole is seen overlying the gastric body. The bowel gas pattern is normal. No radio-opaque calculi or other significant radiographic abnormality are seen. IMPRESSION: Orogastric tube positioning, as described above. Electronically  Signed   By: Aram Candela M.D.   On: 08/25/2022 19:05   US Venous Img Upper Bilat (DVT)  Result Date: 08/25/2022 CLINICAL DATA:  Ludwig's angina  EXAM: BILATERAL UPPER EXTREMITY VENOUS DOPPLER ULTRASOUND TECHNIQUE: Gray-scale sonography with graded compression, as well as color Doppler and duplex ultrasound were performed to evaluate the bilateral upper extremity deep venous systems from the level of the subclavian vein and including the jugular, axillary, basilic, radial, ulnar and upper cephalic vein. Spectral Doppler was utilized to evaluate flow at rest and with distal augmentation maneuvers. COMPARISON:  None Available. FINDINGS: RIGHT UPPER EXTREMITY Internal Jugular Vein: No evidence of thrombus. Normal compressibility, respiratory phasicity and response to augmentation. Subclavian Vein: No evidence of thrombus. Normal compressibility, respiratory phasicity and response to augmentation. Axillary Vein: No evidence of thrombus. Normal compressibility, respiratory phasicity and response to augmentation. Cephalic Vein: No evidence of thrombus. Normal compressibility, respiratory phasicity and response to augmentation. Basilic Vein: No evidence of thrombus. Normal compressibility, respiratory phasicity and response to augmentation. Brachial Veins: No evidence of thrombus. Normal compressibility, respiratory phasicity and response to augmentation. Radial Veins: No evidence of thrombus. Normal compressibility, respiratory phasicity and response to augmentation. Ulnar Veins: No evidence of thrombus. Normal compressibility, respiratory phasicity and response to augmentation. Venous Reflux:  None. Other Findings:  None. LEFT UPPER EXTREMITY Internal Jugular Vein: No evidence of thrombus. Normal compressibility, respiratory phasicity and response to augmentation. Subclavian Vein: No evidence of thrombus. Normal compressibility, respiratory phasicity and response to augmentation. Axillary Vein: No evidence of thrombus. Normal compressibility, respiratory phasicity and response to augmentation. Cephalic Vein: No evidence of thrombus. Normal compressibility,  respiratory phasicity and response to augmentation. Basilic Vein: No evidence of thrombus. Normal compressibility, respiratory phasicity and response to augmentation. Brachial Veins: No evidence of thrombus. Normal compressibility, respiratory phasicity and response to augmentation. Radial Veins: No evidence of thrombus. Normal compressibility, respiratory phasicity and response to augmentation. Ulnar Veins: No evidence of thrombus. Normal compressibility, respiratory phasicity and response to augmentation. Venous Reflux:  None. Other Findings:  None. IMPRESSION: No evidence of DVT within either upper extremity. Specifically the internal jugular veins are within normal limits. Electronically Signed   By: Alcide Clever M.D.   On: 08/25/2022 18:12   ECHOCARDIOGRAM COMPLETE  Result Date: 08/25/2022    ECHOCARDIOGRAM REPORT   Patient Name:   Sierra Patterson Date of Exam: 08/25/2022 Medical Rec #:  [id number]      Height:       62.0 in Accession #:    [id number]     Weight:       192.9 lb Date of Birth:  1960-08-04       BSA:          1.883 m Patient Age:    62 years       BP:           82/53 mmHg Patient Gender: F              HR:           66 bpm. Exam Location:  [facility] Procedure: 2D Echo, Cardiac Doppler and Color Doppler Indications:     Bacteremia  History:         Patient has no prior history of Echocardiogram examinations.                  COPD; Signs/Symptoms:Bacteremia.  Sonographer:     Mikki Harbor Referring Phys:  [id number] Judithe Modest Diagnosing Phys: Yvonne Kendall MD  Sonographer Comments: Echo performed with patient supine and on artificial respirator. IMPRESSIONS  1. Left ventricular ejection fraction, by estimation, is 65 to 70%. The left ventricle has normal function. The left ventricle has no regional wall motion abnormalities. Left ventricular diastolic parameters are indeterminate.  2. Pulmonary artery pressure is at least mildly elevated (RVSP 30-35 mmHg plus central venous pressure). Right  ventricular systolic function is normal. The right ventricular size is normal.  3. Left atrial size was severely dilated.  4. The mitral valve is abnormal. Moderate mitral valve regurgitation. No evidence of mitral stenosis.  5. The aortic valve is tricuspid. There is mild calcification of the aortic valve. There is mild thickening of the aortic valve. Aortic valve regurgitation is not visualized. No aortic stenosis is present. FINDINGS  Left Ventricle: Left ventricular ejection fraction, by estimation, is 65 to 70%. The left ventricle has normal function. The left ventricle has no regional wall motion abnormalities. The left ventricular internal cavity size was normal in size. There is  no left ventricular hypertrophy. Left ventricular diastolic parameters are indeterminate. Right Ventricle: Pulmonary artery pressure is at least mildly elevated (RVSP 30-35 mmHg plus central venous pressure). The right ventricular size is normal. No increase in right ventricular wall thickness. Right ventricular systolic function is normal. Left Atrium: Left atrial size was severely dilated. Right Atrium: Right atrial size was normal in size. Pericardium: There is no evidence of pericardial effusion. Mitral Valve: The mitral valve is abnormal. There is moderate thickening of the mitral valve leaflet(s). There is mild calcification of the mitral valve leaflet(s). Moderately decreased mobility of the mitral valve leaflets. Moderate mitral valve regurgitation. No evidence of mitral valve stenosis. MV peak gradient, 37.8 mmHg. The mean mitral valve gradient is 30.0 mmHg. Tricuspid Valve: The tricuspid valve is normal in structure. Tricuspid valve regurgitation is mild. Aortic Valve: The aortic valve is tricuspid. There is mild calcification of the aortic valve. There is mild thickening of the aortic valve. Aortic valve regurgitation is not visualized. No aortic stenosis is present. Aortic valve mean gradient measures 5.0 mmHg. Aortic  valve peak gradient measures 11.4 mmHg. Aortic valve area, by VTI measures 2.23 cm. Pulmonic Valve: The pulmonic valve was not well visualized. Pulmonic valve regurgitation is not visualized. No evidence of pulmonic stenosis. Aorta: The aortic root is normal in size and structure. Pulmonary Artery: The pulmonary artery is not well seen. Venous: The inferior vena cava was not well visualized. IAS/Shunts: No atrial level shunt detected by color flow Doppler.  LEFT VENTRICLE PLAX 2D LVIDd:         5.08 cm LVIDs:         2.58 cm LV PW:         0.89 cm LV IVS:        0.82 cm LVOT diam:     1.90 cm LV SV:         78 LV SV Index:   42 LVOT Area:     2.84 cm  RIGHT VENTRICLE RV Basal diam:  3.65 cm RV Mid diam:    3.40 cm RV S prime:     14.50 cm/s TAPSE (M-mode): 2.5 cm LEFT ATRIUM             Index        RIGHT ATRIUM           Index LA diam:        4.60 cm 2.44 cm/m   RA Area:  17.50 cm LA Vol (A2C):   73.0 ml 38.78 ml/m  RA Volume:   44.50 ml  23.64 ml/m LA Vol (A4C):   99.7 ml 52.96 ml/m LA Biplane Vol: 87.6 ml 46.53 ml/m  AORTIC VALVE                     PULMONIC VALVE AV Area (Vmax):    2.08 cm      PV Vmax:       1.32 m/s AV Area (Vmean):   2.25 cm      PV Peak grad:  7.0 mmHg AV Area (VTI):     2.23 cm AV Vmax:           169.00 cm/s AV Vmean:          103.000 cm/s AV VTI:            0.351 m AV Peak Grad:      11.4 mmHg AV Mean Grad:      5.0 mmHg LVOT Vmax:         124.00 cm/s LVOT Vmean:        81.800 cm/s LVOT VTI:          0.276 m LVOT/AV VTI ratio: 0.79  AORTA Ao Root diam: 2.90 cm MITRAL VALVE                TRICUSPID VALVE MV Area (PHT): 2.01 cm     TR Peak grad:   32.9 mmHg MV Area VTI:   0.69 cm     TR Vmax:        287.00 cm/s MV Peak grad:  37.8 mmHg MV Mean grad:  30.0 mmHg    SHUNTS MV Vmax:       3.08 m/s     Systemic VTI:  0.28 m MV Vmean:      215.8 cm/s   Systemic Diam: 1.90 cm MV Decel Time: 377 msec MV E velocity: 141.00 cm/s MV A velocity: 94.90 cm/s MV E/A ratio:  1.49 Yvonne Kendall MD Electronically signed by Yvonne Kendall MD Signature Date/Time: 08/25/2022/5:35:55 PM    Final    DG Chest Port 1 View  Result Date: 08/25/2022 CLINICAL DATA:  [id number] with hypoxic respiratory failure. EXAM: PORTABLE CHEST 1 VIEW COMPARISON:  Portable chest yesterday at 11:51 a.m. FINDINGS: 5:25 a.m. ETT has been advanced with the tip now abutting the carina and should be withdrawn 5 cm to the mid trachea. NGT new insertion passes well into the stomach but neither the side-hole or tip are filmed. The heart is enlarged. There is increased vascular prominence and flow cephalization. There is also increased interstitial consolidation consistent with edema, with a basal gradient. Small pleural effusions are beginning to develop. There is increased linear left infrahilar atelectasis. No focal airspace infiltrate is seen. In all other respects there are no further changes. Calcification again noted transverse aorta. The mediastinum is normally outlined. IMPRESSION: 1. ETT tip abutting the carina and should be withdrawn 5 cm to the mid trachea. 2. NGT enters the stomach with the side hole and tip not filmed. 3. Increased vascular congestion and interstitial edema. 4. Small pleural effusions. 5. Increased left infrahilar atelectasis. 6. No further changes. Electronically Signed   By: Almira Bar M.D.   On: 08/25/2022 06:43   DG Abd 1 View  Result Date: 08/24/2022 CLINICAL DATA:  OG tube placement EXAM: ABDOMEN - 1 VIEW COMPARISON:  None Available. FINDINGS: Esophagogastric tube with tip and side port  below the diaphragm. Nonobstructive pattern of included bowel gas. No free air. Cardiomegaly. IMPRESSION: Esophagogastric tube with tip and side port below the diaphragm. Electronically Signed   By: Jearld Lesch M.D.   On: 08/24/2022 17:01   DG Chest Port 1 View  Result Date: 08/24/2022 CLINICAL DATA:  62 year old female with history of respiratory arrest. EXAM: PORTABLE CHEST 1 VIEW COMPARISON:  Chest  x-ray 01/10/2014. FINDINGS: An endotracheal tube is in place with tip 0.8 cm above the carina. Lung volumes are normal. No consolidative airspace disease. However, there is diffuse interstitial prominence and widespread peribronchial cuffing. No pleural effusions. No pneumothorax. No pulmonary nodule or mass noted. Pulmonary vasculature and the cardiomediastinal silhouette are within normal limits. IMPRESSION: 1. Support apparatus, as above. Please take note of the low position of the endotracheal tube which is less than 1 cm above the carina, and consider withdrawal approximately 4 cm for more optimal placement. 2. Mild diffuse interstitial prominence and peribronchial cuffing, concerning for an acute bronchitis. Electronically Signed   By: Trudie Reed M.D.   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 that usually causes ludwigs angina or lemierre's This is found in the saliva /oral cavity of cats and dogs I spoke to her brother and she has 2 indoor cats and a few outdoor cats  No cat bites as per history- clinically no obvious wounds Cat licks can cause this infection in the right host ( immune compromised) We need to r/o endocarditis and TEE is needed Currently on zosyn Can deescalate to unasyn soon  Ludwigs angina on steroids along with antibiotic  HEPC- will check HEPC RNA  H/o substance use Urine tox screen positive for amphetamines Not sure whether she was on any meds  Discussed the management with the intensive care team

## 2022-08-26 NOTE — TOC CM/SW Note (Signed)
TOC is following for potential disposition needs. Currently on ventilator. Pharmacy is aware she does not have insurance.  Charlynn Court, CSW [phone]

## 2022-08-26 NOTE — Progress Notes (Signed)
08/26/2022 5:05 PM  Sierra Patterson [id number]  Post-Op Day 2    Temp:  [95.9 F (35.5 C)-99 F (37.2 C)] 98.7 F (37.1 C) (05/29 1200) Pulse Rate:  [44-80] 49 (05/29 1630) Resp:  [8-19] 16 (05/29 1630) BP: (83-147)/(52-100) 91/56 (05/29 1630) SpO2:  [94 %-99 %] 96 % (05/29 1630) FiO2 (%):  [28 %] 28 % (05/29 1525) Weight:  [85.9 kg] 85.9 kg (05/29 0448),     Intake/Output Summary (Last 24 hours) at 08/26/2022 1705 Last data filed at 08/26/2022 1300 Gross per 24 hour  Intake 1480.05 ml  Output 555 ml  Net 925.05 ml    Results for orders placed or performed during the hospital encounter of 08/24/22 (from the past 24 hour(s))  Glucose, capillary     Status: Abnormal   Collection Time: 08/25/22  7:22 PM  Result Value Ref Range   Glucose-Capillary 125 (H) 70 - 99 mg/dL  Urine Drug Screen, Qualitative ([facility] only)     Status: Abnormal   Collection Time: 08/25/22  7:59 PM  Result Value Ref Range   Tricyclic, Ur Screen POSITIVE (A) NONE DETECTED   Amphetamines, Ur Screen POSITIVE (A) NONE DETECTED   MDMA (Ecstasy)Ur Screen NONE DETECTED NONE DETECTED   Cocaine Metabolite,Ur [place] NONE DETECTED NONE DETECTED   Opiate, Ur Screen NONE DETECTED NONE DETECTED   Phencyclidine (PCP) Ur S NONE DETECTED NONE DETECTED   Cannabinoid 50 Ng, Ur [place] NONE DETECTED NONE DETECTED   Barbiturates, Ur Screen NONE DETECTED NONE DETECTED   Benzodiazepine, Ur Scrn POSITIVE (A) NONE DETECTED   Methadone Scn, Ur NONE DETECTED NONE DETECTED  Glucose, capillary     Status: Abnormal   Collection Time: 08/25/22 11:09 PM  Result Value Ref Range   Glucose-Capillary 119 (H) 70 - 99 mg/dL  Glucose, capillary     Status: Abnormal   Collection Time: 08/26/22  3:09 AM  Result Value Ref Range   Glucose-Capillary 127 (H) 70 - 99 mg/dL  CBC     Status: None   Collection Time: 08/26/22  7:24 AM  Result Value Ref Range   WBC  4.0 - 10.5 K/uL    QUESTIONABLE RESULTS, RECOMMEND RECOLLECT TO VERIFY   RBC  3.87 - 5.11  MIL/uL    QUESTIONABLE RESULTS, RECOMMEND RECOLLECT TO VERIFY   Hemoglobin  12.0 - 15.0 g/dL    QUESTIONABLE RESULTS, RECOMMEND RECOLLECT TO VERIFY   HCT  36.0 - 46.0 %    QUESTIONABLE RESULTS, RECOMMEND RECOLLECT TO VERIFY   MCV  80.0 - 100.0 fL    QUESTIONABLE RESULTS, RECOMMEND RECOLLECT TO VERIFY   MCH  26.0 - 34.0 pg    QUESTIONABLE RESULTS, RECOMMEND RECOLLECT TO VERIFY   MCHC  30.0 - 36.0 g/dL    QUESTIONABLE RESULTS, RECOMMEND RECOLLECT TO VERIFY   RDW  11.5 - 15.5 %    QUESTIONABLE RESULTS, RECOMMEND RECOLLECT TO VERIFY   Platelets  150 - 400 K/uL    QUESTIONABLE RESULTS, RECOMMEND RECOLLECT TO VERIFY   nRBC  0.0 - 0.2 %    QUESTIONABLE RESULTS, RECOMMEND RECOLLECT TO VERIFY  Renal function panel     Status: Abnormal   Collection Time: 08/26/22  7:24 AM  Result Value Ref Range   Sodium 135 135 - 145 mmol/L   Potassium 3.8 3.5 - 5.1 mmol/L   Chloride 104 98 - 111 mmol/L   CO2 22 22 - 32 mmol/L   Glucose, Bld 126 (H) 70 - 99 mg/dL   BUN 36 (H)  8 - 23 mg/dL   Creatinine, Ser 9.56 0.44 - 1.00 mg/dL   Calcium 8.0 (L) 8.9 - 10.3 mg/dL   Phosphorus 4.0 2.5 - 4.6 mg/dL   Albumin 2.3 (L) 3.5 - 5.0 g/dL   GFR, Estimated >21 >30 mL/min   Anion gap 9 5 - 15  C-reactive protein     Status: Abnormal   Collection Time: 08/26/22  7:24 AM  Result Value Ref Range   CRP 10.3 (H) <1.0 mg/dL  Procalcitonin     Status: None   Collection Time: 08/26/22  7:24 AM  Result Value Ref Range   Procalcitonin 4.67 ng/mL  Magnesium     Status: None   Collection Time: 08/26/22  7:24 AM  Result Value Ref Range   Magnesium 2.4 1.7 - 2.4 mg/dL  Glucose, capillary     Status: Abnormal   Collection Time: 08/26/22  7:29 AM  Result Value Ref Range   Glucose-Capillary 117 (H) 70 - 99 mg/dL  CBC with Differential/Platelet     Status: Abnormal   Collection Time: 08/26/22 10:28 AM  Result Value Ref Range   WBC 4.5 4.0 - 10.5 K/uL   RBC 3.53 (L) 3.87 - 5.11 MIL/uL   Hemoglobin 11.7 (L) 12.0 - 15.0  g/dL   HCT 86.5 (L) 78.4 - 69.6 %   MCV 91.5 80.0 - 100.0 fL   MCH 33.1 26.0 - 34.0 pg   MCHC 36.2 (H) 30.0 - 36.0 g/dL   RDW 29.5 28.4 - 13.2 %   Platelets 85 (L) 150 - 400 K/uL   nRBC 0.0 0.0 - 0.2 %   Neutrophils Relative % 83 %   Neutro Abs 3.7 1.7 - 7.7 K/uL   Lymphocytes Relative 12 %   Lymphs Abs 0.5 (L) 0.7 - 4.0 K/uL   Monocytes Relative 5 %   Monocytes Absolute 0.2 0.1 - 1.0 K/uL   Eosinophils Relative 0 %   Eosinophils Absolute 0.0 0.0 - 0.5 K/uL   Basophils Relative 0 %   Basophils Absolute 0.0 0.0 - 0.1 K/uL   Immature Granulocytes 0 %   Abs Immature Granulocytes 0.02 0.00 - 0.07 K/uL  Glucose, capillary     Status: Abnormal   Collection Time: 08/26/22 11:27 AM  Result Value Ref Range   Glucose-Capillary 136 (H) 70 - 99 mg/dL  Glucose, capillary     Status: Abnormal   Collection Time: 08/26/22  1:13 PM  Result Value Ref Range   Glucose-Capillary 137 (H) 70 - 99 mg/dL  Glucose, capillary     Status: Abnormal   Collection Time: 08/26/22  4:53 PM  Result Value Ref Range   Glucose-Capillary 137 (H) 70 - 99 mg/dL    SUBJECTIVE:  More awake today.  OBJECTIVE:  OG tube changed to NG tube.  Exam of FOM erythema is better as is swelling, more bruising appearance now.  Exam of uvula which prior to treatment was approx 6-8 times normal size, now almost normal.  Neck swelling also improved.  IMPRESSION:  Ludwig's angina with supraglottitis.  Blood culture grew pasturella, usually susceptible to pcn (on zosyn).  Clinically swelling appears improved.   PLAN:  Improving, swelling appears improved based on exam of uvula.  Impossible to scope and see larynx with tube in place.  I would recommend continue IV antibiotics (as per ID) and decadron another 24 hours.  Maybe repeat CT neck tomorrow afternoon assess submandibular region.  If appears improved, may let cuff down Friday am, if large leak  could attempt extubation.  I will be in the OR in [place] Friday am then out of town.  I  will speak with my partners to let them know the plan.  I will check on the patient tomorrow as well.    [name] 08/26/2022, 5:05 PM

## 2022-08-26 NOTE — Consult Note (Signed)
PHARMACY CONSULT NOTE - FOLLOW UP  Pharmacy Consult for Electrolyte Monitoring and Replacement   Recent Labs: Potassium (mmol/L)  Date Value  08/25/2022 3.8  01/10/2014 3.7   Magnesium (mg/dL)  Date Value  16/12/9602 2.0   Calcium (mg/dL)  Date Value  54/11/8117 8.5 (L)   Calcium, Total (mg/dL)  Date Value  14/78/2956 8.3 (L)   Albumin (g/dL)  Date Value  21/30/8657 2.8 (L)  01/10/2014 3.4   Phosphorus (mg/dL)  Date Value  84/69/6295 2.8   Sodium (mmol/L)  Date Value  08/25/2022 134 (L)  01/10/2014 142   Corrected Calcium: 9.46  Assessment: Patient presented with complaint of right sided lower jaw swelling. Side of face and neck are swollen. Pharmacy has been consulted to monitor and replace electrolytes under PCCM care.  Goal of Therapy:  WNL  Plan:  No current replacement indicated F/u with AM labs.   Bettey Costa ,PharmD Clinical Pharmacist 08/26/2022 7:33 AM

## 2022-08-26 NOTE — Progress Notes (Addendum)
NAME:  Sierra Patterson, MRN:  [id number], DOB:  [birth year], LOS: 2 ADMISSION DATE:  08/24/2022, CONSULTATION DATE:  08/24/2022 REFERRING MD:  Dr. Rosalia Hammers, CHIEF COMPLAINT:  Facial swelling   Brief Pt Description / Synopsis:  62 y.o. 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  History of Present Illness:  Sierra Patterson is a 62 year old female with a past medical history significant for COPD, hypertension, cirrhosis due to hepatitis C who presents to [facility] ED on 08/24/2022 due to complaints of right-sided facial swelling.  Patient is currently intubated and sedated and no family is currently available, therefore history is obtained from chart review.  Per ED and nursing notes the patient was working outside in her yard yesterday evening.  She did not notice any bug bites or bee stings, however yesterday been began to notice some swelling over the right side of her face with some neck soreness.  This morning the swelling worsened prompting her to seek evaluation in the ED.  She reported that is now becoming difficult to swallow and feels as if her throat is swelling and starting to close then.  She denied any ACE inhibitor use, fever, chills, rash, chest pain, shortness of breath, vomiting, nausea, vomiting, diarrhea, abdominal pain, dysuria.  ED Course: Initial Vital Signs: Temperature 100.1 F orally, respiratory rate 24, pulse 107, blood pressure 192/98, SpO2 95% on room air Significant Labs: Sodium 130, potassium 3.4, glucose 127, total bilirubin 2.3, lactic acid 1.4, procalcitonin 0.53, WBC 12.1 with neutrophilia COVID-19 PCR is negative Imaging CT soft tissue of the Neck>>IMPRESSION: Supraglottic laryngeal edema worse towards the right with airway narrowing. There is also notable right submandibular space and gland inflammation, the primary infectious/inflammatory  cause is uncertain. No collection. Medications Administered: IV Unasyn, 10 mg Decadron, 1 L LR  On exam she was noted to have swelling along the right lower face into the submandibular area with tenderness to palpation, along with some uvular swelling and asymmetric edema on the right side of the posterior oropharynx.  She did not exhibit any stridor or wheezing, but her voice was hoarse and somewhat muffled, she was able to manage secretions without issue.  She was evaluated by ENT who recommended intubating in the OR for airway protection.  PCCM is asked to admit for further workup and treatment postintubation.  Please see "significant hospital events" section below for full detailed hospital course.  Pertinent  Medical History   Past Medical History:  Diagnosis Date   Cirrhosis (HCC)     Micro Data:  5/27: Blood culture x2>> 4/4 bottles with gram - rods 5/27: COVID-19/RSV/FLU PCR>>negative 5/27: RVP>> negative 5/27: Group A Strep PCR>>negative 5/27: Strep pneumo urinary antigen>>negative   Antimicrobials:   Anti-infectives (From admission, onward)    Start     Dose/Rate Route Frequency Ordered Stop   08/25/22 1000  piperacillin-tazobactam (ZOSYN) IVPB 3.375 g        3.375 g 12.5 mL/hr over 240 Minutes Intravenous Every 8 hours 08/25/22 0808     08/24/22 1800  Ampicillin-Sulbactam (UNASYN) 3 g in sodium chloride 0.9 % 100 mL IVPB  Status:  Discontinued        3 g 200 mL/hr over 30 Minutes Intravenous Every 6 hours 08/24/22 1033 08/25/22 0803   08/24/22 0915  Ampicillin-Sulbactam (UNASYN) 3 g in sodium chloride 0.9 % 100 mL IVPB        3 g  200 mL/hr over 30 Minutes Intravenous  Once 08/24/22 0914 08/24/22 1017        Significant Hospital Events: Including procedures, antibiotic start and stop dates in addition to other pertinent events   5/27: Presents to ED with right sided facial and throat swelling.  ENT intubated in OR for airway protection.   PCCM asked to  admit 5/28: Blood cultures overnight with Gram - rods.  Will change ABX to Zosyn and consult ID. Obtain Echocardiogram.  Requiring low dose pressors (Levophed 2 mcg) 5/29: No acute events overnight.  Pt remains on minimal vent settings: PEEP 5/FiO2 28%. OGT changed to NGT per ENT recommendations will await ENT's recommendations regarding extubation following ENT airway examination   Interim History / Subjective:  As outlined above   Objective   Blood pressure (!) 90/58, pulse (!) 50, temperature 97.9 F (36.6 C), resp. rate 16, height 5\' 2"  (1.575 m), weight 85.9 kg, SpO2 98 %.    Vent Mode: PRVC FiO2 (%):  [28 %] 28 % Set Rate:  [16 bmp] 16 bmp Vt Set:  [450 mL] 450 mL PEEP:  [5 cmH20] 5 cmH20 Plateau Pressure:  [16 cmH20-18 cmH20] 16 cmH20   Intake/Output Summary (Last 24 hours) at 08/26/2022 1610 Last data filed at 08/26/2022 0600 Gross per 24 hour  Intake 1892.72 ml  Output 942 ml  Net 950.72 ml   Filed Weights   08/24/22 1100 08/25/22 0500 08/26/22 0448  Weight: 81.1 kg 87.5 kg 85.9 kg    Examination: General: Acute on chronically ill appearing female, laying in bed, intubated and sedated, in NAD HENT: Atraumatic, normocephalic, neck supple, no JVD Lungs: Clear throughout, no stridor, even, non labored  Cardiovascular: NSR, s1s2, no m/r/g, no edema Abdomen: +BS x4, soft, non tender, non distended  Extremities: Normal bulk and tone, no deformities, no edema Neuro: Sedated, withdraws from painful stimulation, PERRL  GU: Foley catheter in place draining yellow urine Resolved Hospital Problem list     Assessment & Plan:  #Intubated for airway protection in setting of right sided laryngeal and submandibular space edema #COPD without acute exacerbation - Full vent support, implement lung protective strategies - Plateau pressures less than 30 cm H20 - Wean FiO2 & PEEP as tolerated to maintain O2 sats 88 to 92% - Follow intermittent Chest X-ray & ABG as needed -  Spontaneous breathing trials when respiratory parameters met, mental status permits, & and with + cuff leak - Implement VAP Bundle - Bronchodilators  #Right sided Laryngeal and Submandibular space Edema, 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 No reported bee sting or bug bites and no associated hives or hypotension to suggest anaphylaxis rxn, does not take ACE-i CT Neck on presentation with supraglottic laryngeal edema (worse on right) with airway narrowing and notable right submandibular space and gland inflammation (but NO abscess collection and uncertain primary infectious/inflammatory etiology) - ENT following, appreciate input ~ recommends 48 hrs of ABX & steroids, and then repeating CT scan at that time - IV Decadron 10 mg q8h - IV Pepcid and Benadryl - Complement C4 within normal limits/ CRP results pending   #Meets SIRS Criteria on presentation (HR >100, RR 22, WBC 12.1) #Severe Sepsis due to Gram Negative Rod BACTEREMIA from suspected #Submandibular/Peritonsillar Infection & Ludwig's Angina -Monitor fever curve -Trend WBC's & Procalcitonin -Follow cultures as above - ID consulted appreciate input: recommending zosyn for now due to concern of possible fusobacterium while awaiting culture results and sensitivities   #Hypotension: septic shock and sedation related PMHx: HTN Echo  08/25/22: 65 to 70%; pulmonary artery pressure mildly elevated, left atrial size severely dilated; moderate mitral valve regurgitation  - Continuous cardiac monitoring - Maintain MAP >65 - IV fluids - Vasopressors as needed to maintain MAP goal - Lactic acid is normal (1.4) - Hold outpatient metoprolol  #Mild Hyponatremia~resolved  #Mild Hypokalemia~resolved  - Trend BMP  - Replace electrolytes as indicated - Monitor UOP  #Chronic Thrombocytopenia due to Cirrhosis - Trend CBC  - Monitor for s/sx of bleeding  - Lovenox for VTE Prophylaxis  - Transfuse for Hgb <7 - Transfuse platelets for platelet count <50K with  active bleeding  #Hyperglycemia due to critical illness exacerbated by high dose steroids Hemoglobin A1c 08/25/22: 5.0 - CBG's q4h; Target range of 140 to 180 - SSI - Follow ICU Hypo/Hyperglycemia protocol  #Sedation needs in the setting of mechanical ventilation - Maintain a RASS goal of -1 to -2 (higher RASS given need to ensure airway protected/maintained) - Fentanyl and Propofol to maintain RASS goal - Avoid sedating medications as able - Daily wake up assessment - Continue outpatient clonazepam  Best Practice (right click and "Reselect all SmartList Selections" daily)   Diet/type: NPO, Continue TF's DVT prophylaxis: LMWH GI prophylaxis: H2B Lines: N/A Foley:  yes, and is still needed Code Status:  full code Last date of multidisciplinary goals of care discussion [08/26/22]  5/29: Will update pt's family when they arrive at bedside. Labs   CBC: Recent Labs  Lab 08/24/22 0810 08/24/22 1122 08/25/22 0547  WBC 12.1* 13.2* 15.0*  NEUTROABS 10.6*  --   --   HGB 15.6* 16.4* 13.3  HCT 43.9 45.9 36.6  MCV 91.3 90.9 91.3  PLT PLATELET CLUMPS NOTED ON SMEAR, UNABLE TO ESTIMATE 98* 110*    Basic Metabolic Panel: Recent Labs  Lab 08/24/22 0810 08/25/22 0547 08/25/22 1017 08/26/22 0724  NA 130* 134*  --  135  K 3.4* 3.7 3.8 3.8  CL 99 101  --  104  CO2 22 23  --  22  GLUCOSE 127* 137*  --  126*  BUN 11 21  --  36*  CREATININE 0.78 0.81  --  0.92  CALCIUM 8.8* 8.5*  --  8.0*  MG  --  1.8 2.0 2.4  PHOS  --  2.8  --  4.0   GFR: Estimated Creatinine Clearance: 65.3 mL/min (by C-G formula based on SCr of 0.92 mg/dL). Recent Labs  Lab 08/24/22 0810 08/24/22 1122 08/25/22 0546 08/25/22 0547  PROCALCITON 0.53  --  5.40  --   WBC 12.1* 13.2*  --  15.0*  LATICACIDVEN 1.4  --   --   --     Liver Function Tests: Recent Labs  Lab 08/24/22 0810 08/25/22 0547 08/26/22 0724  AST 31  --   --   ALT 21  --   --   ALKPHOS 67  --   --   BILITOT 2.3*  --   --   PROT  9.0*  --   --   ALBUMIN 3.9 2.8* 2.3*   No results for input(s): "LIPASE", "AMYLASE" in the last 168 hours. No results for input(s): "AMMONIA" in the last 168 hours.  ABG    Component Value Date/Time   PHART 7.37 08/24/2022 1155   PCO2ART 39 08/24/2022 1155   PO2ART 107 08/24/2022 1155   HCO3 22.5 08/24/2022 1155   ACIDBASEDEF 2.5 (H) 08/24/2022 1155   O2SAT 99.1 08/24/2022 1155     Coagulation Profile: Recent Labs  Lab  08/24/22 0810  INR 1.1    Cardiac Enzymes: No results for input(s): "CKTOTAL", "CKMB", "CKMBINDEX", "TROPONINI" in the last 168 hours.  HbA1C: Hgb A1c MFr Bld  Date/Time Value Ref Range Status  08/25/2022 05:46 AM 5.0 4.8 - 5.6 % Final    Comment:    (NOTE)         Prediabetes: 5.7 - 6.4         Diabetes: >6.4         Glycemic control for adults with diabetes: <7.0     CBG: Recent Labs  Lab 08/25/22 1525 08/25/22 1922 08/25/22 2309 08/26/22 0309 08/26/22 0729  GLUCAP 117* 125* 119* 127* 117*    Review of Systems:   Unable to assess due to intubation/sedation/critical illness   Past Medical History:  She,  has a past medical history of Cirrhosis (HCC).   Surgical History:  History reviewed. No pertinent surgical history.   Social History:   reports that she has been smoking cigarettes. She has been smoking an average of .5 packs per day. Her smokeless tobacco use includes snuff. She reports current alcohol use. She reports current drug use.   Family History:  Her family history is not on file.   Allergies Allergies  Allergen Reactions   Bee Venom Anaphylaxis and Swelling   Ibuprofen     REACTION: sick on stomach     Home Medications  Prior to Admission medications   Medication Sig Start Date End Date Taking? Authorizing Provider  clonazePAM (KLONOPIN) 0.5 MG tablet Take 1 tablet by mouth 2 (two) times daily. 08/23/12   [provider]  furosemide (LASIX) 20 MG tablet Take 1 tablet by mouth 2 (two) times daily.  11/25/12   [provider]  gabapentin (NEURONTIN) 300 MG capsule Take 1 capsule by mouth 3 (three) times daily. 08/23/12   [provider]  metoprolol tartrate (LOPRESSOR) 50 MG tablet Take 1 tablet by mouth 2 (two) times daily.  08/23/12   [provider]  naloxone [facility]) nasal spray 4 mg/0.1 mL Administer 1 spray as needed into one nostril in the case of decreased breathing and responsiveness thought to be due to narcotics overdose.  Call 911 and repeat dose every 2 to 3 minutes in alternating nostrils until EMS arrives. 11/18/16   Loleta Rose, MD  OMEPRAZOLE PO Take 1 capsule by mouth 2 (two) times daily.    [provider]  SM MULTIPLE VITAMINS/IRON TABS Take 1 tablet by mouth daily. 08/08/10   [provider]     Critical care time: 40 minutes     Zada Girt, AGNP  Pulmonary/Critical Care Pager [phone] (please enter 7 digits) PCCM Consult Pager [phone] (please enter 7 digits)

## 2022-08-27 ENCOUNTER — Inpatient Hospital Stay: Payer: Medicare Other

## 2022-08-27 LAB — CBC
HCT: 36.2 % (ref 36.0–46.0)
Hemoglobin: 12.3 g/dL (ref 12.0–15.0)
MCH: 31.9 pg (ref 26.0–34.0)
MCHC: 34 g/dL (ref 30.0–36.0)
MCV: 94 fL (ref 80.0–100.0)
Platelets: 83 10*3/uL — ABNORMAL LOW (ref 150–400)
RBC: 3.85 MIL/uL — ABNORMAL LOW (ref 3.87–5.11)
RDW: 14 % (ref 11.5–15.5)
WBC: 3.3 10*3/uL — ABNORMAL LOW (ref 4.0–10.5)
nRBC: 0 % (ref 0.0–0.2)

## 2022-08-27 LAB — PROCALCITONIN: Procalcitonin: 2.23 ng/mL

## 2022-08-27 LAB — RENAL FUNCTION PANEL
Albumin: 2.6 g/dL — ABNORMAL LOW (ref 3.5–5.0)
Anion gap: 9 (ref 5–15)
BUN: 44 mg/dL — ABNORMAL HIGH (ref 8–23)
CO2: 24 mmol/L (ref 22–32)
Calcium: 8.3 mg/dL — ABNORMAL LOW (ref 8.9–10.3)
Chloride: 105 mmol/L (ref 98–111)
Creatinine, Ser: 0.85 mg/dL (ref 0.44–1.00)
GFR, Estimated: >60 mL/min (ref >60)
Glucose, Bld: 144 mg/dL — ABNORMAL HIGH (ref 70–99)
Phosphorus: 4.2 mg/dL (ref 2.5–4.6)
Potassium: 3.7 mmol/L (ref 3.5–5.1)
Sodium: 138 mmol/L (ref 135–145)

## 2022-08-27 LAB — GLUCOSE, CAPILLARY
Glucose-Capillary: 140 mg/dL — ABNORMAL HIGH (ref 70–99)
Glucose-Capillary: 134 mg/dL — ABNORMAL HIGH (ref 70–99)
Glucose-Capillary: 140 mg/dL — ABNORMAL HIGH (ref 70–99)
Glucose-Capillary: 132 mg/dL — ABNORMAL HIGH (ref 70–99)
Glucose-Capillary: 148 mg/dL — ABNORMAL HIGH (ref 70–99)
Glucose-Capillary: 130 mg/dL — ABNORMAL HIGH (ref 70–99)

## 2022-08-27 LAB — MAGNESIUM: Magnesium: 2.5 mg/dL — ABNORMAL HIGH (ref 1.7–2.4)

## 2022-08-27 LAB — CULTURE, BLOOD (ROUTINE X 2): Culture: NO GROWTH

## 2022-08-27 MED ORDER — ORAL CARE MOUTH RINSE
15.0000 mL | OROMUCOSAL | Status: DC
  Administered 2022-08-27 – 2022-09-01 (×57): 15 mL via OROMUCOSAL

## 2022-08-27 MED ORDER — ORAL CARE MOUTH RINSE: 15.0000 mL | OROMUCOSAL | Status: DC | PRN

## 2022-08-27 MED ORDER — IOHEXOL 300 MG/ML  SOLN
75.0000 mL | Freq: Once | INTRAMUSCULAR | Status: AC | PRN
  Administered 2022-08-27: 75 mL via INTRAVENOUS

## 2022-08-27 MED ORDER — LACTATED RINGERS IV SOLN: INTRAVENOUS | Status: DC

## 2022-08-27 MED ORDER — SODIUM CHLORIDE 0.9 % IV SOLN
3.0000 g | Freq: Four times a day (QID) | INTRAVENOUS | Status: DC
  Administered 2022-08-27 – 2022-09-03 (×28): 3 g via INTRAVENOUS
  Filled 2022-08-27 (×31): qty 8

## 2022-08-27 NOTE — Progress Notes (Signed)
10:00 wake up assessment, sedation decreased to 50%, pt able to follow commands 10:15-pt switched to SBT mode 13:00-pt  taken down for CT of neck, sedation resumed.

## 2022-08-27 NOTE — Progress Notes (Signed)
Date of Admission:  08/24/2022    ID: Sierra Patterson is a 62 y.o. female Principal Problem:   Laryngeal edema Active Problems:   Acute hypoxic respiratory failure (HCC)   Ludwig's angina   Sepsis (HCC)   Gram-negative bacteremia    Subjective: Remains intubated and sedated  Medications:   Chlorhexidine Gluconate Cloth  6 each Topical Nightly   clonazepam  0.5 mg Per Tube BID   docusate  100 mg Per Tube BID   enoxaparin (LOVENOX) injection  40 mg Subcutaneous Q24H   feeding supplement (PROSource TF20)  60 mL Per Tube Daily   free water  30 mL Per Tube Q4H   insulin aspart  0-15 Units Subcutaneous Q4H   mouth rinse  15 mL Mouth Rinse Q2H   polyethylene glycol  17 g Per Tube Daily    Objective: Vital signs in last 24 hours: Patient Vitals for the past 24 hrs:  BP Temp Temp src Pulse Resp SpO2 Weight  08/27/22 1930 104/68 98.5 F (36.9 C) Oral (!) 54 16 97 % --  08/27/22 1900 110/72 -- -- 60 16 97 % --  08/27/22 1830 100/65 -- -- (!) 56 16 98 % --  08/27/22 1800 117/75 -- -- (!) 58 16 98 % --  08/27/22 1730 98/64 -- -- (!) 51 16 99 % --  08/27/22 1700 101/62 -- -- (!) 55 16 98 % --  08/27/22 1630 116/73 -- -- (!) 59 16 98 % --  08/27/22 1600 109/73 97.9 F (36.6 C) Oral (!) 55 16 98 % --  08/27/22 1530 124/86 -- -- (!) 57 16 100 % --  08/27/22 1500 103/67 -- -- (!) 57 17 99 % --  08/27/22 1430 114/72 -- -- 60 16 99 % --  08/27/22 1400 108/78 -- -- 62 15 98 % --  08/27/22 1350 -- -- -- (!) 58 16 97 % --  08/27/22 1345 -- -- -- (!) 59 16 97 % --  08/27/22 1340 -- -- -- 61 16 98 % --  08/27/22 1330 116/74 -- -- 64 18 97 % --  08/27/22 1320 -- -- -- 62 18 96 % --  08/27/22 1315 -- -- -- 62 17 98 % --  08/27/22 1310 -- -- -- 65 18 95 % --  08/27/22 1300 113/74 -- -- 65 16 98 % --  08/27/22 1250 -- -- -- 66 16 96 % --  08/27/22 1245 -- -- -- 61 16 94 % --  08/27/22 1240 -- -- -- 64 16 94 % --  08/27/22 1230 120/76 -- -- 70 16 94 % --  08/27/22 1220 -- -- -- 97 20 99 %  --  08/27/22 1215 -- -- -- 94 13 98 % --  08/27/22 1210 -- -- -- 87 14 95 % --  08/27/22 1200 128/80 97.9 F (36.6 C) Oral 79 13 98 % --  08/27/22 1145 -- -- -- 96 (!) 21 98 % --  08/27/22 1130 131/85 (!) 97.1 F (36.2 C) Axillary 73 14 96 % --  08/27/22 1115 -- -- -- 80 19 99 % --  08/27/22 1100 112/72 -- -- 63 14 97 % --  08/27/22 1045 -- -- -- 62 11 98 % --  08/27/22 1030 109/65 -- -- 65 10 96 % --  08/27/22 1015 -- -- -- 81 15 99 % --  08/27/22 1000 106/67 -- -- (!) 59 16 97 % --  08/27/22 0945 -- -- -- 61  16 97 % --  08/27/22 0930 104/65 -- -- 60 16 97 % --  08/27/22 0915 -- -- -- 61 15 96 % --  08/27/22 0900 112/67 -- -- 63 16 97 % --  08/27/22 0845 -- -- -- 61 16 95 % --  08/27/22 0830 103/63 -- -- 63 15 96 % --  08/27/22 0815 -- -- -- 68 16 95 % --  08/27/22 0800 (!) 143/89 98.2 F (36.8 C) Axillary 86 -- 98 % --  08/27/22 0745 -- -- -- 65 -- 96 % --  08/27/22 0730 101/63 -- -- 65 -- 95 % --  08/27/22 0715 -- -- -- 66 -- 94 % --  08/27/22 0700 (!) 99/57 -- -- 70 -- 94 % --  08/27/22 0600 130/87 -- -- 76 -- 98 % --  08/27/22 0500 128/77 -- -- 78 14 98 % 90.3 kg  08/27/22 0400 110/73 98 F (36.7 C) Oral 61 16 95 % --  08/27/22 0300 98/66 -- -- (!) 47 16 96 % --  08/27/22 0200 96/63 -- -- (!) 48 16 96 % --  08/27/22 0100 110/71 -- -- (!) 49 16 96 % --  08/27/22 0000 109/66 97.9 F (36.6 C) Oral (!) 48 16 94 % --  08/26/22 2300 106/75 -- -- (!) 42 14 95 % --  08/26/22 2200 100/71 -- -- (!) 48 16 96 % --     PHYSICAL EXAM:  General: nasally intubated sedated.  Head: Normocephalic, without obvious abnormality, atraumatic. Eyes: Conjunctivae clear, anicteric sclerae. Pupils are equal ENT cannot assess Neck: swelling less Lungs: b/l air entry Heart: bradycardia. Abdomen: Soft, non-tender,not distended. Bowel sounds normal. No masses Extremities: spider veins over legs Skin: scabbed excoriation/superficial scratch No wounds No injuries Neurologic: cannot  assess  Lab Results    Latest Ref Rng & Units 08/27/2022    4:50 AM 08/26/2022   10:28 AM 08/26/2022    7:24 AM  CBC  WBC 4.0 - 10.5 K/uL 3.3  4.5  QUESTIONABLE RESULTS, RECOMMEND RECOLLECT TO VERIFY  C  Hemoglobin 12.0 - 15.0 g/dL 09.8  11.9  QUESTIONABLE RESULTS, RECOMMEND RECOLLECT TO VERIFY  C  Hematocrit 36.0 - 46.0 % 36.2  32.3  QUESTIONABLE RESULTS, RECOMMEND RECOLLECT TO VERIFY  C  Platelets 150 - 400 K/uL 83  85  QUESTIONABLE RESULTS, RECOMMEND RECOLLECT TO VERIFY  C    C Corrected result       Latest Ref Rng & Units 08/27/2022    4:50 AM 08/26/2022    7:24 AM 08/25/2022   10:17 AM  CMP  Glucose 70 - 99 mg/dL 147  829    BUN 8 - 23 mg/dL 44  36    Creatinine 5.62 - 1.00 mg/dL 1.30  8.65    Sodium 784 - 145 mmol/L 138  135    Potassium 3.5 - 5.1 mmol/L 3.7  3.8  3.8   Chloride 98 - 111 mmol/L 105  104    CO2 22 - 32 mmol/L 24  22    Calcium 8.9 - 10.3 mg/dL 8.3  8.0        Microbiology: BC x4 pasteurella  Studies/Results: CT SOFT TISSUE NECK W CONTRAST  Result Date: 08/27/2022 CLINICAL DATA:  Soft tissue infection suspected, neck, xray done Assess submandibular region. EXAM: CT NECK WITH CONTRAST TECHNIQUE: Multidetector CT imaging of the neck was performed using the standard protocol following the bolus administration of intravenous contrast. RADIATION DOSE REDUCTION: This exam was performed according  to the departmental dose-optimization program which includes automated exposure control, adjustment of the mA and/or kV according to patient size and/or use of iterative reconstruction technique. CONTRAST:  75mL OMNIPAQUE IOHEXOL 300 MG/ML  SOLN COMPARISON:  Neck CT 08/24/2022. FINDINGS: Pharynx and larynx: Endotracheal tube terminates near the carina. Retained secretions in the oral cavity and pharynx. Interval improvement in right oropharyngeal and supraglottic laryngeal edema. Salivary glands: Interval resolution of inflammation in the right submandibular space. Other  salivary glands are unremarkable. Thyroid: Normal. Lymph nodes: No suspicious cervical lymphadenopathy. Vascular: Extensive atherosclerotic vascular changes of the carotid bulbs and proximal ICAs. Limited intracranial: Unremarkable. Visualized orbits: Unremarkable. Mastoids and visualized paranasal sinuses: Well aerated. Skeleton: Unremarkable. Upper chest: Unremarkable. Other: None. IMPRESSION: 1. Interval improvement in right oropharyngeal and supraglottic laryngeal edema. Interval resolution of inflammation in the right submandibular space. 2. Endotracheal tube terminates near the carina. Retraction of about 2 cm is recommended to prevent mainstem bronchus intubation. Electronically Signed   By: Orvan Falconer M.D.   On: 08/27/2022 17:25   DG Chest Port 1 View  Result Date: 08/26/2022 CLINICAL DATA:  Acute respiratory failure EXAM: PORTABLE CHEST 1 VIEW COMPARISON:  Chest x-ray dated Aug 25, 2022 FINDINGS: Interval retraction of ETT with tip 2 cm from the carina. Enteric tube partially visualized coursing below the diaphragm. Heart size and mediastinal contours are within normal limits. Unchanged linear retrocardiac opacity of the left lung base, likely atelectasis. Lungs are otherwise clear. No pleural effusion or pneumothorax. IMPRESSION: 1. Interval retraction of ETT with tip 2 cm from the carina. 2. Unchanged left basilar atelectasis. Electronically Signed   By: Allegra Lai M.D.   On: 08/26/2022 08:51     Assessment/Plan: Pasteurella bacteremia-  This is not the bacteria that usually causes ludwigs angina or lemierre's This is found in the saliva /oral cavity of cats and dogs she has 2 indoor cats and a few outdoor cats  No cat bites as per history- clinically no obvious wounds Cat licks can cause this infection in the right host ( immune compromised) TEE is needed to r/o endocarditis Currently on zosyn- change to unasyn   Ludwigs angina on steroids along with antibiotic  HEPC- will  check HEPC RNA  H/o substance use Urine tox screen positive for amphetamines   Discussed the management with the intensive care team

## 2022-08-27 NOTE — Consult Note (Signed)
PHARMACY CONSULT NOTE - FOLLOW UP  Pharmacy Consult for Electrolyte Monitoring and Replacement   Recent Labs: Potassium (mmol/L)  Date Value  08/27/2022 3.7  01/10/2014 3.7   Magnesium (mg/dL)  Date Value  40/98/1191 2.5 (H)   Calcium (mg/dL)  Date Value  47/82/9562 8.3 (L)   Calcium, Total (mg/dL)  Date Value  13/10/6576 8.3 (L)   Albumin (g/dL)  Date Value  46/96/2952 2.6 (L)  01/10/2014 3.4   Phosphorus (mg/dL)  Date Value  84/13/2440 4.2   Sodium (mmol/L)  Date Value  08/27/2022 138  01/10/2014 142   Corrected Calcium: 9.42  Assessment: Patient presented with complaint of right sided lower jaw swelling. Side of face and neck are swollen. Pharmacy has been consulted to monitor and replace electrolytes under PCCM care.  Goal of Therapy:  WNL  Plan:  No current replacement indicated F/u with AM labs.   Bettey Costa ,PharmD Clinical Pharmacist 08/27/2022 7:53 AM

## 2022-08-27 NOTE — Progress Notes (Signed)
NAME:  Sierra Patterson, MRN:  [id number], DOB:  Aug 04, 1960, LOS: 3 ADMISSION DATE:  08/24/2022, CONSULTATION DATE:  08/24/2022 REFERRING MD:  Dr. Rosalia Hammers, CHIEF COMPLAINT:  Facial swelling   Brief Pt Description / Synopsis:  62 y.o. 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  History of Present Illness:  Sierra Patterson is a 62 year old female with a past medical history significant for COPD, hypertension, cirrhosis due to hepatitis C who presents to [facility] ED on 08/24/2022 due to complaints of right-sided facial swelling.  Patient is currently intubated and sedated and no family is currently available, therefore history is obtained from chart review.  Per ED and nursing notes the patient was working outside in her yard yesterday evening.  She did not notice any bug bites or bee stings, however yesterday been began to notice some swelling over the right side of her face with some neck soreness.  This morning the swelling worsened prompting her to seek evaluation in the ED.  She reported that is now becoming difficult to swallow and feels as if her throat is swelling and starting to close then.  She denied any ACE inhibitor use, fever, chills, rash, chest pain, shortness of breath, vomiting, nausea, vomiting, diarrhea, abdominal pain, dysuria.  ED Course: Initial Vital Signs: Temperature 100.1 F orally, respiratory rate 24, pulse 107, blood pressure 192/98, SpO2 95% on room air Significant Labs: Sodium 130, potassium 3.4, glucose 127, total bilirubin 2.3, lactic acid 1.4, procalcitonin 0.53, WBC 12.1 with neutrophilia COVID-19 PCR is negative Imaging CT soft tissue of the Neck>>IMPRESSION: Supraglottic laryngeal edema worse towards the right with airway narrowing. There is also notable right submandibular space and gland inflammation, the primary infectious/inflammatory  cause is uncertain. No collection. Medications Administered: IV Unasyn, 10 mg Decadron, 1 L LR  On exam she was noted to have swelling along the right lower face into the submandibular area with tenderness to palpation, along with some uvular swelling and asymmetric edema on the right side of the posterior oropharynx.  She did not exhibit any stridor or wheezing, but her voice was hoarse and somewhat muffled, she was able to manage secretions without issue.  She was evaluated by ENT who recommended intubating in the OR for airway protection.  PCCM is asked to admit for further workup and treatment postintubation.  Please see "significant hospital events" section below for full detailed hospital course.  Pertinent  Medical History   Past Medical History:  Diagnosis Date   Cirrhosis (HCC)     Micro Data:  5/27: Blood culture x2>> 4/4 bottles with gram - rods 5/27: COVID-19/RSV/FLU PCR>>negative 5/27: RVP>> negative 5/27: Group A Strep PCR>>negative 5/27: Strep pneumo urinary antigen>>negative   Antimicrobials:   Anti-infectives (From admission, onward)    Start     Dose/Rate Route Frequency Ordered Stop   08/25/22 1000  piperacillin-tazobactam (ZOSYN) IVPB 3.375 g        3.375 g 12.5 mL/hr over 240 Minutes Intravenous Every 8 hours 08/25/22 0808     08/24/22 1800  Ampicillin-Sulbactam (UNASYN) 3 g in sodium chloride 0.9 % 100 mL IVPB  Status:  Discontinued        3 g 200 mL/hr over 30 Minutes Intravenous Every 6 hours 08/24/22 1033 08/25/22 0803   08/24/22 0915  Ampicillin-Sulbactam (UNASYN) 3 g in sodium chloride 0.9 % 100 mL IVPB        3 g  200 mL/hr over 30 Minutes Intravenous  Once 08/24/22 0914 08/24/22 1017        Significant Hospital Events: Including procedures, antibiotic start and stop dates in addition to other pertinent events   5/27: Presents to ED with right sided facial and throat swelling.  ENT intubated in OR for airway protection.   PCCM asked to  admit 5/28: Blood cultures overnight with Gram - rods.  Will change ABX to Zosyn and consult ID. Obtain Echocardiogram.  Requiring low dose pressors (Levophed 2 mcg) 5/29: No acute events overnight.  Pt remains on minimal vent settings: PEEP 5/FiO2 28%. OGT changed to NGT per ENT recommendations will await ENT's recommendations regarding extubation following ENT airway examination  5/30: Increased peak pressures overnight, resolved with pt repositioning.  Per ENT recommendations CT Soft Tissue Neck with Contrast pending this afternoon to assess airway for possible extubation in the next 24 to 48hrs.  Pt remains on minimal vent settings    Interim History / Subjective:  As outlined above under significant events  Objective   Blood pressure 130/87, pulse 76, temperature 98 F (36.7 C), temperature source Oral, resp. rate 14, height 5\' 2"  (1.575 m), weight 90.3 kg, SpO2 98 %.    Vent Mode: PRVC FiO2 (%):  [28 %] 28 % Set Rate:  [16 bmp] 16 bmp Vt Set:  [450 mL] 450 mL PEEP:  [5 cmH20] 5 cmH20   Intake/Output Summary (Last 24 hours) at 08/27/2022 0809 Last data filed at 08/27/2022 0603 Gross per 24 hour  Intake 2045.02 ml  Output 1050 ml  Net 995.02 ml   Filed Weights   08/25/22 0500 08/26/22 0448 08/27/22 0500  Weight: 87.5 kg 85.9 kg 90.3 kg    Examination: General: Acute on chronically ill appearing female, laying in bed, intubated and sedated, in NAD HENT: Atraumatic, normocephalic, neck supple, no JVD Lungs: Faint rhonchi throughout, no stridor, even, non labored  Cardiovascular: NSR, s1s2, no m/r/g, no edema Abdomen: +BS x4, soft, non tender, non distended  Extremities: Normal bulk and tone, no deformities, no edema Neuro: Sedated, withdraws from painful stimulation, PERRL  GU: Purewick in place   Resolved Hospital Problem list     Assessment & Plan:  #Intubated for airway protection in setting of right sided laryngeal and submandibular space edema #COPD without acute  exacerbation - Full vent support, implement lung protective strategies - Plateau pressures less than 30 cm H20 - Wean FiO2 & PEEP as tolerated to maintain O2 sats 88 to 92% - Follow intermittent Chest X-ray & ABG as needed - Spontaneous breathing trials when respiratory parameters met, mental status permits, & and with + cuff leak - Repeat CT Soft Tissue Neck with Contrast today to assess submandibular region if improved plan for extubation in the next 24 to 48hrs per ENT  - Implement VAP Bundle - Bronchodilators  #Right sided Laryngeal and Submandibular space Edema, 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 No reported bee sting or bug bites and no associated hives or hypotension to suggest anaphylaxis rxn, does not take ACE-i CT Neck on presentation with supraglottic laryngeal edema (worse on right) with airway narrowing and notable right submandibular space and gland inflammation (but NO abscess collection and uncertain primary infectious/inflammatory etiology) - ENT following, appreciate input ~ recommends 48 hrs of ABX & steroids, and then repeating CT scan at that time - IV Pepcid  - Complement C4 within normal limits/ CRP elevated at 10.3   #Meets SIRS Criteria on presentation (HR >100, RR 22, WBC 12.1) #  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  #Submandibular/Peritonsillar Infection & Ludwig's Angina - Monitor fever curve - Trend WBC's & Procalcitonin - Follow cultures as above - ID consulted appreciate input: continue zosyn for now  - Cardiology consulted for TEE to r/o endocarditis, however will need ENT clearance prior to TEE   #Hypotension: septic shock and sedation related PMHx: HTN Echo 08/25/22: 65 to 70%; pulmonary artery pressure mildly elevated, left atrial size severely dilated; moderate mitral valve regurgitation  - Continuous cardiac monitoring - Maintain MAP >65 - IV fluids - Vasopressors as needed to maintain MAP goal - Lactic acid is normal (1.4) - Hold outpatient  metoprolol  #Mild Hyponatremia~resolved  #Mild Hypokalemia~resolved  - Trend BMP  - Replace electrolytes as indicated - Monitor UOP  #Chronic Thrombocytopenia due to Cirrhosis - Trend CBC  - Monitor for s/sx of bleeding  - Lovenox for VTE Prophylaxis  - Transfuse for Hgb <7 - Transfuse platelets for platelet count <50K with active bleeding  #Hyperglycemia due to critical illness exacerbated by high dose steroids Hemoglobin A1c 08/25/22: 5.0 - CBG's q4h; Target range of 140 to 180 - SSI - Follow ICU Hypo/Hyperglycemia protocol  #Sedation needs in the setting of mechanical ventilation - Maintain a RASS goal of -1 to -2 (higher RASS given need to ensure airway protected/maintained) - Fentanyl and Propofol to maintain RASS goal - Avoid sedating medications as able - Daily wake up assessment - Continue outpatient clonazepam  Best Practice (right click and "Reselect all SmartList Selections" daily)   Diet/type: NPO, Continue TF's DVT prophylaxis: LMWH GI prophylaxis: H2B Lines: N/A Foley: N/A Code Status:  full code Last date of multidisciplinary goals of care discussion [08/27/22]  5/30: Will update pt's family when they arrive at bedside. Labs   CBC: Recent Labs  Lab 08/24/22 0810 08/24/22 1122 08/25/22 0547 08/26/22 0724 08/26/22 1028 08/27/22 0450  WBC 12.1* 13.2* 15.0* QUESTIONABLE RESULTS, RECOMMEND RECOLLECT TO VERIFY 4.5 3.3*  NEUTROABS 10.6*  --   --   --  3.7  --   HGB 15.6* 16.4* 13.3 QUESTIONABLE RESULTS, RECOMMEND RECOLLECT TO VERIFY 11.7* 12.3  HCT 43.9 45.9 36.6 QUESTIONABLE RESULTS, RECOMMEND RECOLLECT TO VERIFY 32.3* 36.2  MCV 91.3 90.9 91.3 QUESTIONABLE RESULTS, RECOMMEND RECOLLECT TO VERIFY 91.5 94.0  PLT PLATELET CLUMPS NOTED ON SMEAR, UNABLE TO ESTIMATE 98* 110* QUESTIONABLE RESULTS, RECOMMEND RECOLLECT TO VERIFY 85* 83*    Basic Metabolic Panel: Recent Labs  Lab 08/24/22 0810 08/25/22 0547 08/25/22 1017 08/26/22 0724 08/27/22 0450  NA  130* 134*  --  135 138  K 3.4* 3.7 3.8 3.8 3.7  CL 99 101  --  104 105  CO2 22 23  --  22 24  GLUCOSE 127* 137*  --  126* 144*  BUN 11 21  --  36* 44*  CREATININE 0.78 0.81  --  0.92 0.85  CALCIUM 8.8* 8.5*  --  8.0* 8.3*  MG  --  1.8 2.0 2.4 2.5*  PHOS  --  2.8  --  4.0 4.2   GFR: Estimated Creatinine Clearance: 72.6 mL/min (by C-G formula based on SCr of 0.85 mg/dL). Recent Labs  Lab 08/24/22 0810 08/24/22 1122 08/25/22 0546 08/25/22 0547 08/26/22 0724 08/26/22 1028 08/27/22 0450  PROCALCITON 0.53  --  5.40  --  4.67  --   --   WBC 12.1*   < >  --  15.0* QUESTIONABLE RESULTS, RECOMMEND RECOLLECT TO VERIFY 4.5 3.3*  LATICACIDVEN 1.4  --   --   --   --   --   --    < > =  values in this interval not displayed.    Liver Function Tests: Recent Labs  Lab 08/24/22 0810 08/25/22 0547 08/26/22 0724 08/27/22 0450  AST 31  --   --   --   ALT 21  --   --   --   ALKPHOS 67  --   --   --   BILITOT 2.3*  --   --   --   PROT 9.0*  --   --   --   ALBUMIN 3.9 2.8* 2.3* 2.6*   No results for input(s): "LIPASE", "AMYLASE" in the last 168 hours. No results for input(s): "AMMONIA" in the last 168 hours.  ABG    Component Value Date/Time   PHART 7.37 08/24/2022 1155   PCO2ART 39 08/24/2022 1155   PO2ART 107 08/24/2022 1155   HCO3 22.5 08/24/2022 1155   ACIDBASEDEF 2.5 (H) 08/24/2022 1155   O2SAT 99.1 08/24/2022 1155     Coagulation Profile: Recent Labs  Lab 08/24/22 0810  INR 1.1    Cardiac Enzymes: No results for input(s): "CKTOTAL", "CKMB", "CKMBINDEX", "TROPONINI" in the last 168 hours.  HbA1C: Hgb A1c MFr Bld  Date/Time Value Ref Range Status  08/25/2022 05:46 AM 5.0 4.8 - 5.6 % Final    Comment:    (NOTE)         Prediabetes: 5.7 - 6.4         Diabetes: >6.4         Glycemic control for adults with diabetes: <7.0     CBG: Recent Labs  Lab 08/26/22 1653 08/26/22 1928 08/26/22 2345 08/27/22 0334 08/27/22 0736  GLUCAP 137* 126* 142* 140* 134*     Review of Systems:   Unable to assess due to intubation/sedation/critical illness   Past Medical History:  She,  has a past medical history of Cirrhosis (HCC).   Surgical History:   Past Surgical History:  Procedure Laterality Date   TRACHEOSTOMY TUBE PLACEMENT N/A 08/24/2022   Procedure: NASAL INTUBATION;  Surgeon: Linus Salmons, MD;  Location: [facility] ORS;  Service: ENT;  Laterality: N/A;     Social History:   reports that she has been smoking cigarettes. She has been smoking an average of .5 packs per day. Her smokeless tobacco use includes snuff. She reports current alcohol use. She reports current drug use.   Family History:  Her family history is not on file.   Allergies Allergies  Allergen Reactions   Bee Venom Anaphylaxis and Swelling   Ibuprofen     REACTION: sick on stomach     Home Medications  Prior to Admission medications   Medication Sig Start Date End Date Taking? Authorizing Provider  clonazePAM (KLONOPIN) 0.5 MG tablet Take 1 tablet by mouth 2 (two) times daily. 08/23/12   [provider]  furosemide (LASIX) 20 MG tablet Take 1 tablet by mouth 2 (two) times daily. 11/25/12   [provider]  gabapentin (NEURONTIN) 300 MG capsule Take 1 capsule by mouth 3 (three) times daily. 08/23/12   [provider]  metoprolol tartrate (LOPRESSOR) 50 MG tablet Take 1 tablet by mouth 2 (two) times daily.  08/23/12   [provider]  naloxone [facility]) nasal spray 4 mg/0.1 mL Administer 1 spray as needed into one nostril in the case of decreased breathing and responsiveness thought to be due to narcotics overdose.  Call 911 and repeat dose every 2 to 3 minutes in alternating nostrils until EMS arrives. 11/18/16   Loleta Rose, MD  OMEPRAZOLE PO  Take 1 capsule by mouth 2 (two) times daily.    [provider]  SM MULTIPLE VITAMINS/IRON TABS Take 1 tablet by mouth daily. 08/08/10   [provider]     Critical care time: 40  minutes     Zada Girt, AGNP  Pulmonary/Critical Care Pager [phone] (please enter 7 digits) PCCM Consult Pager [phone] (please enter 7 digits)

## 2022-08-27 NOTE — Progress Notes (Signed)
08/27/2022 4:51 PM  Sierra Patterson [id number]  Post-Op Day 3    Temp:  [97.1 F (36.2 C)-98.9 F (37.2 C)] 97.9 F (36.6 C) (05/30 1600) Pulse Rate:  [42-97] 59 (05/30 1630) Resp:  [10-21] 16 (05/30 1630) BP: (96-143)/(57-89) 116/73 (05/30 1630) SpO2:  [94 %-100 %] 98 % (05/30 1630) FiO2 (%):  [28 %] 28 % (05/30 1605) Weight:  [90.3 kg] 90.3 kg (05/30 0500),     Intake/Output Summary (Last 24 hours) at 08/27/2022 1651 Last data filed at 08/27/2022 1632 Gross per 24 hour  Intake 2708.16 ml  Output 1350 ml  Net 1358.16 ml    Results for orders placed or performed during the hospital encounter of 08/24/22 (from the past 24 hour(s))  Glucose, capillary     Status: Abnormal   Collection Time: 08/26/22  4:53 PM  Result Value Ref Range   Glucose-Capillary 137 (H) 70 - 99 mg/dL  Glucose, capillary     Status: Abnormal   Collection Time: 08/26/22  7:28 PM  Result Value Ref Range   Glucose-Capillary 126 (H) 70 - 99 mg/dL  Glucose, capillary     Status: Abnormal   Collection Time: 08/26/22 11:45 PM  Result Value Ref Range   Glucose-Capillary 142 (H) 70 - 99 mg/dL  Glucose, capillary     Status: Abnormal   Collection Time: 08/27/22  3:34 AM  Result Value Ref Range   Glucose-Capillary 140 (H) 70 - 99 mg/dL  CBC     Status: Abnormal   Collection Time: 08/27/22  4:50 AM  Result Value Ref Range   WBC 3.3 (L) 4.0 - 10.5 K/uL   RBC 3.85 (L) 3.87 - 5.11 MIL/uL   Hemoglobin 12.3 12.0 - 15.0 g/dL   HCT 40.9 81.1 - 91.4 %   MCV 94.0 80.0 - 100.0 fL   MCH 31.9 26.0 - 34.0 pg   MCHC 34.0 30.0 - 36.0 g/dL   RDW 78.2 95.6 - 21.3 %   Platelets 83 (L) 150 - 400 K/uL   nRBC 0.0 0.0 - 0.2 %  Renal function panel     Status: Abnormal   Collection Time: 08/27/22  4:50 AM  Result Value Ref Range   Sodium 138 135 - 145 mmol/L   Potassium 3.7 3.5 - 5.1 mmol/L   Chloride 105 98 - 111 mmol/L   CO2 24 22 - 32 mmol/L   Glucose, Bld 144 (H) 70 - 99 mg/dL   BUN 44 (H) 8 - 23 mg/dL    Creatinine, Ser 0.86 0.44 - 1.00 mg/dL   Calcium 8.3 (L) 8.9 - 10.3 mg/dL   Phosphorus 4.2 2.5 - 4.6 mg/dL   Albumin 2.6 (L) 3.5 - 5.0 g/dL   GFR, Estimated >57 >84 mL/min   Anion gap 9 5 - 15  Magnesium     Status: Abnormal   Collection Time: 08/27/22  4:50 AM  Result Value Ref Range   Magnesium 2.5 (H) 1.7 - 2.4 mg/dL  Procalcitonin     Status: None   Collection Time: 08/27/22  4:50 AM  Result Value Ref Range   Procalcitonin 2.23 ng/mL  Glucose, capillary     Status: Abnormal   Collection Time: 08/27/22  7:36 AM  Result Value Ref Range   Glucose-Capillary 134 (H) 70 - 99 mg/dL  Glucose, capillary     Status: Abnormal   Collection Time: 08/27/22 11:15 AM  Result Value Ref Range   Glucose-Capillary 140 (H) 70 - 99 mg/dL  Glucose, capillary  Status: Abnormal   Collection Time: 08/27/22  4:24 PM  Result Value Ref Range   Glucose-Capillary 132 (H) 70 - 99 mg/dL    SUBJECTIVE: Patient is intubated less sedated today does respond to commands  OBJECTIVE: The anterior tube appears in place examination of the oropharynx showed the uvula is essentially back to normal size.  The floor mouth swelling has decreased.  Palpation the neck was sent without significant swelling.2  IMPRESSION: Ludwig's angina with supraglottitis.  Appears to be improved.  The uvula is now approximately normal size.  I have reviewed the CT scan and has not been formally read by radiology, but the swelling of the right neck and submandibular region appears improved.  Difficult to assess the glottis with endotracheal tube in position but swelling does appear better on my viewing.  PLAN: Will continue IV antibiotics and steroids overnight.  Tomorrow morning would recommend deflating the cuff with patient's significantly less sedated.  If there appears to be an adequate leak, extubation could be attempted.  I will be in surgery and [place] in the morning, my partner Dr. Andee Poles is aware of the patient and possible  attempted extubation tomorrow.  If there are any questions feel free to contact me.  [name] 08/27/2022, 4:51 PM

## 2022-08-27 NOTE — Progress Notes (Addendum)
Bladder scanned pt, yield 410 cc. Per Dr. Aundria Rud okay to insert foley.

## 2022-08-28 ENCOUNTER — Inpatient Hospital Stay: Payer: Medicare Other

## 2022-08-28 LAB — CBC
HCT: 33.6 % — ABNORMAL LOW (ref 36.0–46.0)
Hemoglobin: 11.5 g/dL — ABNORMAL LOW (ref 12.0–15.0)
MCH: 32.6 pg (ref 26.0–34.0)
MCHC: 34.2 g/dL (ref 30.0–36.0)
MCV: 95.2 fL (ref 80.0–100.0)
Platelets: 77 10*3/uL — ABNORMAL LOW (ref 150–400)
RBC: 3.53 MIL/uL — ABNORMAL LOW (ref 3.87–5.11)
RDW: 14.1 % (ref 11.5–15.5)
WBC: 2.4 10*3/uL — ABNORMAL LOW (ref 4.0–10.5)
nRBC: 0 % (ref 0.0–0.2)

## 2022-08-28 LAB — GLUCOSE, CAPILLARY
Glucose-Capillary: 113 mg/dL — ABNORMAL HIGH (ref 70–99)
Glucose-Capillary: 102 mg/dL — ABNORMAL HIGH (ref 70–99)
Glucose-Capillary: 87 mg/dL (ref 70–99)
Glucose-Capillary: 97 mg/dL (ref 70–99)
Glucose-Capillary: 88 mg/dL (ref 70–99)
Glucose-Capillary: 79 mg/dL (ref 70–99)

## 2022-08-28 LAB — CULTURE, BLOOD (ROUTINE X 2): Culture: NO GROWTH

## 2022-08-28 LAB — MAGNESIUM: Magnesium: 2.3 mg/dL (ref 1.7–2.4)

## 2022-08-28 LAB — RENAL FUNCTION PANEL
Albumin: 2.3 g/dL — ABNORMAL LOW (ref 3.5–5.0)
Anion gap: 6 (ref 5–15)
BUN: 33 mg/dL — ABNORMAL HIGH (ref 8–23)
CO2: 26 mmol/L (ref 22–32)
Calcium: 7.9 mg/dL — ABNORMAL LOW (ref 8.9–10.3)
Chloride: 107 mmol/L (ref 98–111)
Creatinine, Ser: 0.7 mg/dL (ref 0.44–1.00)
GFR, Estimated: >60 mL/min (ref >60)
Glucose, Bld: 113 mg/dL — ABNORMAL HIGH (ref 70–99)
Phosphorus: 3.2 mg/dL (ref 2.5–4.6)
Potassium: 3.9 mmol/L (ref 3.5–5.1)
Sodium: 139 mmol/L (ref 135–145)

## 2022-08-28 LAB — TRIGLYCERIDES: Triglycerides: 82 mg/dL (ref <150)

## 2022-08-28 NOTE — Progress Notes (Signed)

## 2022-08-28 NOTE — Op Note (Signed)
08/28/2022  7:39 AM    Eldred Manges  [id number]  Date of procedure 08/24/2022   Pre-Op Dx: airway obstruction  Post-op Dx: SAME  Proc: Flexible transnasal fiberoptic bronchoscopy with intubation  Surg:  [name]  Anes:  GOT  EBL: 0  Comp: None  Findings: Significant uvular and supraglottic edema  Procedure: Sierra Patterson was taken emergently from the emergency room to the operating room.  Topical anesthetic of lidocaine gel was placed on a nasal trumpet through the right nostril.  A 6.5 nasotracheal tube was placed over the flexible bronchoscope.  With mild sedation given by anesthesia, the flexible bronchoscope was introduced through the right nostril.  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  The flexible bronchoscope was placed through the nasopharynx there was significant edema of the uvula.  This was passed into the hypopharynx.  There was significant edema of the epiglottis and the supraglottic region.  The bronchoscope was used to visualize the vocal folds.  The bronchoscope was then advanced through the larynx into the subglottis and into the trachea.  It was advanced down to the carina.  The use of tracheal tube was then advanced over the bronchoscope in a Seldinger fashion.  The bronchoscope was then removed leaving the endotracheal tube in position.  This was then secured by anesthesia.  With the airway secured the patient was transported to the intensive care unit for continued IV antibiotics and steroids.  Dispo:   Stable  Plan: Remain intubated until the swelling has decreased and infection treated.  [name]  08/28/2022 7:39 AM

## 2022-08-28 NOTE — Consult Note (Signed)
PHARMACY CONSULT NOTE - FOLLOW UP  Pharmacy Consult for Electrolyte Monitoring and Replacement   Recent Labs: Potassium (mmol/L)  Date Value  08/28/2022 3.9  01/10/2014 3.7   Magnesium (mg/dL)  Date Value  09/81/1914 2.3   Calcium (mg/dL)  Date Value  78/29/5621 7.9 (L)   Calcium, Total (mg/dL)  Date Value  30/86/5784 8.3 (L)   Albumin (g/dL)  Date Value  69/62/9528 2.3 (L)  01/10/2014 3.4   Phosphorus (mg/dL)  Date Value  41/32/4401 3.2   Sodium (mmol/L)  Date Value  08/28/2022 139  01/10/2014 142   Corrected Calcium: 9.42  Assessment: Patient presented with complaint of right sided lower jaw swelling. Side of face and neck are swollen. Pharmacy has been consulted to monitor and replace electrolytes under PCCM care.  Goal of Therapy:  WNL  Plan:  No current replacement indicated F/u with AM labs.   Bettey Costa ,PharmD Clinical Pharmacist 08/28/2022 7:54 AM

## 2022-08-28 NOTE — Progress Notes (Signed)
Date of Admission:  08/24/2022    ID: Sierra Patterson is a 62 y.o. female Principal Problem:   Laryngeal edema Active Problems:   Acute hypoxic respiratory failure (HCC)   Ludwig's angina   Sepsis (HCC)   Gram-negative bacteremia    Subjective: Remains intubated and sedated  Medications:   Chlorhexidine Gluconate Cloth  6 each Topical Nightly   clonazepam  0.5 mg Per Tube BID   docusate  100 mg Per Tube BID   enoxaparin (LOVENOX) injection  40 mg Subcutaneous Q24H   feeding supplement (PROSource TF20)  60 mL Per Tube Daily   free water  30 mL Per Tube Q4H   insulin aspart  0-15 Units Subcutaneous Q4H   mouth rinse  15 mL Mouth Rinse Q2H   polyethylene glycol  17 g Per Tube Daily    Objective: Vital signs in last 24 hours: Patient Vitals for the past 24 hrs:  BP Temp Temp src Pulse Resp SpO2 Weight  08/28/22 1126 -- -- -- -- -- 99 % --  08/28/22 1100 -- -- -- (!) 55 16 99 % --  08/28/22 1030 113/75 -- -- 67 16 100 % --  08/28/22 1000 94/63 -- -- (!) 51 16 98 % --  08/28/22 0930 94/63 -- -- (!) 53 16 99 % --  08/28/22 0900 100/65 -- -- (!) 54 16 99 % --  08/28/22 0830 100/66 -- -- (!) 57 16 100 % --  08/28/22 0800 103/67 -- -- (!) 58 16 100 % --  08/28/22 0737 -- -- -- -- -- 98 % --  08/28/22 0730 116/75 -- -- 63 16 100 % --  08/28/22 0700 (!) 146/85 -- -- 74 17 98 % --  08/28/22 0600 97/62 -- -- (!) 52 16 98 % --  08/28/22 0500 103/69 -- -- (!) 53 16 99 % 91.8 kg  08/28/22 0400 93/66 99.5 F (37.5 C) Axillary (!) 48 16 99 % --  08/28/22 0300 98/68 -- -- 61 16 97 % --  08/28/22 0230 -- 99.3 F (37.4 C) Oral -- -- -- --  08/28/22 0200 101/61 -- -- 62 16 98 % --  08/28/22 0100 116/68 -- -- 63 16 98 % --  08/28/22 0000 (!) 90/59 98 F (36.7 C) Axillary (!) 55 16 98 % --  08/27/22 2300 97/64 -- -- (!) 58 16 99 % --  08/27/22 2200 114/72 98 F (36.7 C) Axillary (!) 58 16 99 % --  08/27/22 2100 (!) 90/56 -- -- (!) 48 16 99 % --  08/27/22 2000 93/60 -- -- (!) 53 16 98  % --  08/27/22 1930 104/68 98.5 F (36.9 C) Oral (!) 54 16 97 % --  08/27/22 1900 110/72 -- -- 60 16 97 % --  08/27/22 1830 100/65 -- -- (!) 56 16 98 % --  08/27/22 1800 117/75 -- -- (!) 58 16 98 % --  08/27/22 1730 98/64 -- -- (!) 51 16 99 % --  08/27/22 1700 101/62 -- -- (!) 55 16 98 % --  08/27/22 1630 116/73 -- -- (!) 59 16 98 % --  08/27/22 1600 109/73 97.9 F (36.6 C) Oral (!) 55 16 98 % --  08/27/22 1530 124/86 -- -- (!) 57 16 100 % --  08/27/22 1500 103/67 -- -- (!) 57 17 99 % --  08/27/22 1430 114/72 -- -- 60 16 99 % --  08/27/22 1400 108/78 -- -- 62 15 98 % --  08/27/22 1350 -- -- -- (!) 58 16 97 % --  08/27/22 1345 -- -- -- (!) 59 16 97 % --  08/27/22 1340 -- -- -- 61 16 98 % --  08/27/22 1330 116/74 -- -- 64 18 97 % --  08/27/22 1320 -- -- -- 62 18 96 % --  08/27/22 1315 -- -- -- 62 17 98 % --  08/27/22 1310 -- -- -- 65 18 95 % --  08/27/22 1300 113/74 -- -- 65 16 98 % --  08/27/22 1250 -- -- -- 66 16 96 % --  08/27/22 1245 -- -- -- 61 16 94 % --  08/27/22 1240 -- -- -- 64 16 94 % --  08/27/22 1230 120/76 -- -- 70 16 94 % --  08/27/22 1220 -- -- -- 97 20 99 % --  08/27/22 1215 -- -- -- 94 13 98 % --  08/27/22 1210 -- -- -- 87 14 95 % --  08/27/22 1200 128/80 97.9 F (36.6 C) Oral 79 13 98 % --  08/27/22 1145 -- -- -- 96 (!) 21 98 % --     PHYSICAL EXAM:  General: nasally intubated sedated.  Head: Normocephalic, without obvious abnormality, atraumatic. Eyes: Conjunctivae clear, anicteric sclerae. Pupils are equal ENT cannot assess Neck: swelling resolved Lungs: b/l air entry Heart: bradycardia. Abdomen: Soft, non-tender,not distended. Bowel sounds normal. No masses Extremities: spider veins over legs Skin: scabbed excoriation/superficial scratch No wounds No injuries Neurologic: cannot assess  Lab Results    Latest Ref Rng & Units 08/28/2022    6:58 AM 08/27/2022    4:50 AM 08/26/2022   10:28 AM  CBC  WBC 4.0 - 10.5 K/uL 2.4  3.3  4.5   Hemoglobin  12.0 - 15.0 g/dL 16.1  09.6  04.5   Hematocrit 36.0 - 46.0 % 33.6  36.2  32.3   Platelets 150 - 400 K/uL 77  83  85        Latest Ref Rng & Units 08/28/2022    6:58 AM 08/27/2022    4:50 AM 08/26/2022    7:24 AM  CMP  Glucose 70 - 99 mg/dL 409  811  914   BUN 8 - 23 mg/dL 33  44  36   Creatinine 0.44 - 1.00 mg/dL 7.82  9.56  2.13   Sodium 135 - 145 mmol/L 139  138  135   Potassium 3.5 - 5.1 mmol/L 3.9  3.7  3.8   Chloride 98 - 111 mmol/L 107  105  104   CO2 22 - 32 mmol/L 26  24  22    Calcium 8.9 - 10.3 mg/dL 7.9  8.3  8.0       Microbiology: BC x4 pasteurella  Studies/Results: DG Abd 1 View  Result Date: 08/28/2022 CLINICAL DATA:  [id number] Other ascites [id number] EXAM: ABDOMEN - 1 VIEW COMPARISON:  Radiograph 08/25/2022 FINDINGS: Nasogastric tube tip and side port overlie the stomach. No evidence of bowel obstruction. No acute osseous abnormality. Metallic clips noted overlying the pelvis. IMPRESSION: No evidence of bowel obstruction. Nasogastric tube tip and side port overlies the stomach. Electronically Signed   By: Caprice Renshaw M.D.   On: 08/28/2022 11:28   CT SOFT TISSUE NECK W CONTRAST  Result Date: 08/27/2022 CLINICAL DATA:  Soft tissue infection suspected, neck, xray done Assess submandibular region. EXAM: CT NECK WITH CONTRAST TECHNIQUE: Multidetector CT imaging of the neck was performed using the standard protocol following the bolus administration of intravenous contrast.  RADIATION DOSE REDUCTION: This exam was performed according to the departmental dose-optimization program which includes automated exposure control, adjustment of the mA and/or kV according to patient size and/or use of iterative reconstruction technique. CONTRAST:  75mL OMNIPAQUE IOHEXOL 300 MG/ML  SOLN COMPARISON:  Neck CT 08/24/2022. FINDINGS: Pharynx and larynx: Endotracheal tube terminates near the carina. Retained secretions in the oral cavity and pharynx. Interval improvement in right oropharyngeal and  supraglottic laryngeal edema. Salivary glands: Interval resolution of inflammation in the right submandibular space. Other salivary glands are unremarkable. Thyroid: Normal. Lymph nodes: No suspicious cervical lymphadenopathy. Vascular: Extensive atherosclerotic vascular changes of the carotid bulbs and proximal ICAs. Limited intracranial: Unremarkable. Visualized orbits: Unremarkable. Mastoids and visualized paranasal sinuses: Well aerated. Skeleton: Unremarkable. Upper chest: Unremarkable. Other: None. IMPRESSION: 1. Interval improvement in right oropharyngeal and supraglottic laryngeal edema. Interval resolution of inflammation in the right submandibular space. 2. Endotracheal tube terminates near the carina. Retraction of about 2 cm is recommended to prevent mainstem bronchus intubation. Electronically Signed   By: Orvan Falconer M.D.   On: 08/27/2022 17:25     Assessment/Plan: Pasteurella bacteremia-  TEE is needed to r/o endocarditis On unasyn   Ludwigs angina on steroids along with antibiotic  HEPC- will check HEPC RNA Has baseline intermittent leucopenia and thrombocytopenia since 2009 - cirrhosis  H/o substance use Urine tox screen positive for amphetamines   Discussed the management with intensivist [facility] on call this weekend- available by phone for urgent issue

## 2022-08-28 NOTE — Progress Notes (Signed)
NAME:  TAISA COWELL, MRN:  [id number], DOB:  1960/12/13, LOS: 4 ADMISSION DATE:  08/24/2022, CONSULTATION DATE:  08/24/2022 REFERRING MD:  Dr. Rosalia Hammers, CHIEF COMPLAINT:  Facial swelling   Brief Pt Description / Synopsis:  62 y.o. 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  History of Present Illness:  Sierra Patterson is a 62 year old female with a past medical history significant for COPD, hypertension, cirrhosis due to hepatitis C who presents to [facility] ED on 08/24/2022 due to complaints of right-sided facial swelling.  Patient is currently intubated and sedated and no family is currently available, therefore history is obtained from chart review.  Per ED and nursing notes the patient was working outside in her yard yesterday evening.  She did not notice any bug bites or bee stings, however yesterday been began to notice some swelling over the right side of her face with some neck soreness.  This morning the swelling worsened prompting her to seek evaluation in the ED.  She reported that is now becoming difficult to swallow and feels as if her throat is swelling and starting to close then.  She denied any ACE inhibitor use, fever, chills, rash, chest pain, shortness of breath, vomiting, nausea, vomiting, diarrhea, abdominal pain, dysuria.  ED Course: Initial Vital Signs: Temperature 100.1 F orally, respiratory rate 24, pulse 107, blood pressure 192/98, SpO2 95% on room air Significant Labs: Sodium 130, potassium 3.4, glucose 127, total bilirubin 2.3, lactic acid 1.4, procalcitonin 0.53, WBC 12.1 with neutrophilia COVID-19 PCR is negative Imaging CT soft tissue of the Neck>>IMPRESSION: Supraglottic laryngeal edema worse towards the right with airway narrowing. There is also notable right submandibular space and gland inflammation, the primary infectious/inflammatory  cause is uncertain. No collection. Medications Administered: IV Unasyn, 10 mg Decadron, 1 L LR  On exam she was noted to have swelling along the right lower face into the submandibular area with tenderness to palpation, along with some uvular swelling and asymmetric edema on the right side of the posterior oropharynx.  She did not exhibit any stridor or wheezing, but her voice was hoarse and somewhat muffled, she was able to manage secretions without issue.  She was evaluated by ENT who recommended intubating in the OR for airway protection.  PCCM is asked to admit for further workup and treatment postintubation.  Please see "significant hospital events" section below for full detailed hospital course.    Pertinent  Medical History   Past Medical History:  Diagnosis Date   Cirrhosis (HCC)     Micro Data:  5/27: Blood culture x2>> 4/4 bottles with gram - rods 5/27: COVID-19/RSV/FLU PCR>>negative 5/27: RVP>> negative 5/27: Group A Strep PCR>>negative 5/27: Strep pneumo urinary antigen>>negative   Antimicrobials:   Anti-infectives (From admission, onward)    Start     Dose/Rate Route Frequency Ordered Stop   08/27/22 1330  Ampicillin-Sulbactam (UNASYN) 3 g in sodium chloride 0.9 % 100 mL IVPB        3 g 200 mL/hr over 30 Minutes Intravenous Every 6 hours 08/27/22 1238     08/25/22 1000  piperacillin-tazobactam (ZOSYN) IVPB 3.375 g  Status:  Discontinued        3.375 g 12.5 mL/hr over 240 Minutes Intravenous Every 8 hours 08/25/22 0808 08/27/22 1238   08/24/22 1800  Ampicillin-Sulbactam (UNASYN) 3 g in sodium chloride 0.9 % 100 mL IVPB  Status:  Discontinued  3 g 200 mL/hr over 30 Minutes Intravenous Every 6 hours 08/24/22 1033 08/25/22 0803   08/24/22 0915  Ampicillin-Sulbactam (UNASYN) 3 g in sodium chloride 0.9 % 100 mL IVPB        3 g 200 mL/hr over 30 Minutes Intravenous  Once 08/24/22 0914 08/24/22 1017        Significant Hospital Events: Including  procedures, antibiotic start and stop dates in addition to other pertinent events   5/27: Presents to ED with right sided facial and throat swelling.  ENT intubated in OR for airway protection.   PCCM asked to admit 5/28: Blood cultures overnight with Gram - rods.  Will change ABX to Zosyn and consult ID. Obtain Echocardiogram.  Requiring low dose pressors (Levophed 2 mcg) 5/29: No acute events overnight.  Pt remains on minimal vent settings: PEEP 5/FiO2 28%. OGT changed to NGT per ENT recommendations will await ENT's recommendations regarding extubation following ENT airway examination  5/30: Increased peak pressures overnight, resolved with pt repositioning.  Per ENT recommendations CT Soft Tissue Neck with Contrast pending this afternoon to assess airway for possible extubation in the next 24 to 48hrs.  Pt remains on minimal vent settings   08/28/22- patient is with no acute events overnight. Reviewed medical plan with ENT no additional surgical intervention planned. ID with some concern post microbiology pasturella results, patient may need TEE. Cardiology will be consulted for this procedure.   Interim History / Subjective:  As outlined above under significant events  Objective   Blood pressure 103/67, pulse (!) 58, temperature 99.5 F (37.5 C), temperature source Axillary, resp. rate 16, height 5\' 2"  (1.575 m), weight 91.8 kg, SpO2 100 %.    Vent Mode: PRVC FiO2 (%):  [28 %] 28 % Set Rate:  [16 bmp] 16 bmp Vt Set:  [450 mL] 450 mL PEEP:  [5 cmH20] 5 cmH20 Pressure Support:  [5 cmH20] 5 cmH20 Plateau Pressure:  [18 cmH20] 18 cmH20   Intake/Output Summary (Last 24 hours) at 08/28/2022 0901 Last data filed at 08/28/2022 1610 Gross per 24 hour  Intake 3743.05 ml  Output 1415 ml  Net 2328.05 ml    Filed Weights   08/26/22 0448 08/27/22 0500 08/28/22 0500  Weight: 85.9 kg 90.3 kg 91.8 kg    Examination: General: Acute on chronically ill appearing female, laying in bed, intubated and  sedated, in NAD HENT: Atraumatic, normocephalic, neck supple, no JVD Lungs: Faint rhonchi throughout, no stridor, even, non labored  Cardiovascular: NSR, s1s2, no m/r/g, no edema Abdomen: +BS x4, soft, non tender, non distended  Extremities: Normal bulk and tone, no deformities, no edema Neuro: Sedated, withdraws from painful stimulation, PERRL  GU: Purewick in place   Resolved Hospital Problem list     Assessment & Plan:  #Intubated for airway protection in setting of right sided laryngeal and submandibular space edema #COPD without acute exacerbation - Full vent support, implement lung protective strategies - Plateau pressures less than 30 cm H20 - Wean FiO2 & PEEP as tolerated to maintain O2 sats 88 to 92% - Follow intermittent Chest X-ray & ABG as needed - Spontaneous breathing trials when respiratory parameters met, mental status permits, & and with + cuff leak - Repeat CT Soft Tissue Neck with Contrast today to assess submandibular region if improved plan for extubation in the next 24 to 48hrs per ENT  - Implement VAP Bundle - Bronchodilators  #Right sided Laryngeal and Submandibular space Edema, 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 No reported bee sting or  bug bites and no associated hives or hypotension to suggest anaphylaxis rxn, does not take ACE-i CT Neck on presentation with supraglottic laryngeal edema (worse on right) with airway narrowing and notable right submandibular space and gland inflammation (but NO abscess collection and uncertain primary infectious/inflammatory etiology) - ENT following, appreciate input ~ recommends 48 hrs of ABX & steroids, and then repeating CT scan at that time - IV Pepcid  - Complement C4 within normal limits/ CRP elevated at 10.3   #Meets SIRS Criteria on presentation (HR >100, RR 22, WBC 12.1) #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  #Submandibular/Peritonsillar Infection & Ludwig's Angina - Monitor fever curve - Trend WBC's &  Procalcitonin - Follow cultures as above - ID consulted appreciate input: continue zosyn for now  - Cardiology consulted for TEE to r/o endocarditis, however will need ENT clearance prior to TEE   #Hypotension: septic shock and sedation related PMHx: HTN Echo 08/25/22: 65 to 70%; pulmonary artery pressure mildly elevated, left atrial size severely dilated; moderate mitral valve regurgitation  - Continuous cardiac monitoring - Maintain MAP >65 - IV fluids - Vasopressors as needed to maintain MAP goal - Lactic acid is normal (1.4) - Hold outpatient metoprolol  #Mild Hyponatremia~resolved  #Mild Hypokalemia~resolved  - Trend BMP  - Replace electrolytes as indicated - Monitor UOP  #Chronic Thrombocytopenia due to Cirrhosis - Trend CBC  - Monitor for s/sx of bleeding  - Lovenox for VTE Prophylaxis  - Transfuse for Hgb <7 - Transfuse platelets for platelet count <50K with active bleeding  #Hyperglycemia due to critical illness exacerbated by high dose steroids Hemoglobin A1c 08/25/22: 5.0 - CBG's q4h; Target range of 140 to 180 - SSI - Follow ICU Hypo/Hyperglycemia protocol  #Sedation needs in the setting of mechanical ventilation - Maintain a RASS goal of -1 to -2 (higher RASS given need to ensure airway protected/maintained) - Fentanyl and Propofol to maintain RASS goal - Avoid sedating medications as able - Daily wake up assessment - Continue outpatient clonazepam  Best Practice (right click and "Reselect all SmartList Selections" daily)   Diet/type: NPO, Continue TF's DVT prophylaxis: LMWH GI prophylaxis: H2B Lines: N/A Foley: N/A Code Status:  full code Last date of multidisciplinary goals of care discussion [08/27/22]   Labs   CBC: Recent Labs  Lab 08/24/22 0810 08/24/22 1122 08/25/22 0547 08/26/22 0724 08/26/22 1028 08/27/22 0450 08/28/22 0658  WBC 12.1*   < > 15.0* QUESTIONABLE RESULTS, RECOMMEND RECOLLECT TO VERIFY 4.5 3.3* 2.4*  NEUTROABS 10.6*  --    --   --  3.7  --   --   HGB 15.6*   < > 13.3 QUESTIONABLE RESULTS, RECOMMEND RECOLLECT TO VERIFY 11.7* 12.3 11.5*  HCT 43.9   < > 36.6 QUESTIONABLE RESULTS, RECOMMEND RECOLLECT TO VERIFY 32.3* 36.2 33.6*  MCV 91.3   < > 91.3 QUESTIONABLE RESULTS, RECOMMEND RECOLLECT TO VERIFY 91.5 94.0 95.2  PLT PLATELET CLUMPS NOTED ON SMEAR, UNABLE TO ESTIMATE   < > 110* QUESTIONABLE RESULTS, RECOMMEND RECOLLECT TO VERIFY 85* 83* 77*   < > = values in this interval not displayed.     Basic Metabolic Panel: Recent Labs  Lab 08/24/22 0810 08/25/22 0547 08/25/22 1017 08/26/22 0724 08/27/22 0450 08/28/22 0658  NA 130* 134*  --  135 138 139  K 3.4* 3.7 3.8 3.8 3.7 3.9  CL 99 101  --  104 105 107  CO2 22 23  --  22 24 26   GLUCOSE 127* 137*  --  126* 144* 113*  BUN 11 21  --  36* 44* 33*  CREATININE 0.78 0.81  --  0.92 0.85 0.70  CALCIUM 8.8* 8.5*  --  8.0* 8.3* 7.9*  MG  --  1.8 2.0 2.4 2.5* 2.3  PHOS  --  2.8  --  4.0 4.2 3.2    GFR: Estimated Creatinine Clearance: 77.9 mL/min (by C-G formula based on SCr of 0.7 mg/dL). Recent Labs  Lab 08/24/22 0810 08/24/22 1122 08/25/22 0546 08/25/22 0547 08/26/22 0724 08/26/22 1028 08/27/22 0450 08/28/22 0658  PROCALCITON 0.53  --  5.40  --  4.67  --  2.23  --   WBC 12.1*   < >  --    < > QUESTIONABLE RESULTS, RECOMMEND RECOLLECT TO VERIFY 4.5 3.3* 2.4*  LATICACIDVEN 1.4  --   --   --   --   --   --   --    < > = values in this interval not displayed.     Liver Function Tests: Recent Labs  Lab 08/24/22 0810 08/25/22 0547 08/26/22 0724 08/27/22 0450 08/28/22 0658  AST 31  --   --   --   --   ALT 21  --   --   --   --   ALKPHOS 67  --   --   --   --   BILITOT 2.3*  --   --   --   --   PROT 9.0*  --   --   --   --   ALBUMIN 3.9 2.8* 2.3* 2.6* 2.3*    No results for input(s): "LIPASE", "AMYLASE" in the last 168 hours. No results for input(s): "AMMONIA" in the last 168 hours.  ABG    Component Value Date/Time   PHART 7.37 08/24/2022  1155   PCO2ART 39 08/24/2022 1155   PO2ART 107 08/24/2022 1155   HCO3 22.5 08/24/2022 1155   ACIDBASEDEF 2.5 (H) 08/24/2022 1155   O2SAT 99.1 08/24/2022 1155     Coagulation Profile: Recent Labs  Lab 08/24/22 0810  INR 1.1     Cardiac Enzymes: No results for input(s): "CKTOTAL", "CKMB", "CKMBINDEX", "TROPONINI" in the last 168 hours.  HbA1C: Hgb A1c MFr Bld  Date/Time Value Ref Range Status  08/25/2022 05:46 AM 5.0 4.8 - 5.6 % Final    Comment:    (NOTE)         Prediabetes: 5.7 - 6.4         Diabetes: >6.4         Glycemic control for adults with diabetes: <7.0     CBG: Recent Labs  Lab 08/27/22 1624 08/27/22 1948 08/27/22 2321 08/28/22 0345 08/28/22 0749  GLUCAP 132* 148* 130* 113* 102*     Review of Systems:   Unable to assess due to intubation/sedation/critical illness   Past Medical History:  She,  has a past medical history of Cirrhosis (HCC).   Surgical History:   Past Surgical History:  Procedure Laterality Date   TRACHEOSTOMY TUBE PLACEMENT N/A 08/24/2022   Procedure: NASAL INTUBATION;  Surgeon: Linus Salmons, MD;  Location: [facility] ORS;  Service: ENT;  Laterality: N/A;     Social History:   reports that she has been smoking cigarettes. She has been smoking an average of .5 packs per day. Her smokeless tobacco use includes snuff. She reports current alcohol use. She reports current drug use.   Family History:  Her family history is not on file.   Allergies Allergies  Allergen Reactions   Bee Venom Anaphylaxis and Swelling   Ibuprofen     REACTION: sick on stomach     Home Medications  Prior to Admission medications   Medication Sig Start Date End Date Taking? Authorizing Provider  clonazePAM (KLONOPIN) 0.5 MG tablet Take 1 tablet by mouth 2 (two) times daily. 08/23/12   [provider]  furosemide (LASIX) 20 MG tablet Take 1 tablet by mouth 2 (two) times daily. 11/25/12   [provider]  gabapentin (NEURONTIN) 300  MG capsule Take 1 capsule by mouth 3 (three) times daily. 08/23/12   [provider]  metoprolol tartrate (LOPRESSOR) 50 MG tablet Take 1 tablet by mouth 2 (two) times daily.  08/23/12   [provider]  naloxone [facility]) nasal spray 4 mg/0.1 mL Administer 1 spray as needed into one nostril in the case of decreased breathing and responsiveness thought to be due to narcotics overdose.  Call 911 and repeat dose every 2 to 3 minutes in alternating nostrils until EMS arrives. 11/18/16   Loleta Rose, MD  OMEPRAZOLE PO Take 1 capsule by mouth 2 (two) times daily.    [provider]  SM MULTIPLE VITAMINS/IRON TABS Take 1 tablet by mouth daily. 08/08/10   [provider]     Critical care provider statement:   Total critical care time: 33 minutes   Performed by: Karna Christmas MD   Critical care time was exclusive of separately billable procedures and treating other patients.   Critical care was necessary to treat or prevent imminent or life-threatening deterioration.   Critical care was time spent personally by me on the following activities: development of treatment plan with patient and/or surrogate as well as nursing, discussions with consultants, evaluation of patient's response to treatment, examination of patient, obtaining history from patient or surrogate, ordering and performing treatments and interventions, ordering and review of laboratory studies, ordering and review of radiographic studies, pulse oximetry and re-evaluation of patient's condition.    Vida Rigger, M.D.  Pulmonary & Critical Care Medicine

## 2022-08-29 LAB — HCV RNA (INTERNATIONAL UNITS)
HCV RNA (International Units): 13200000 IU/mL
HCV log10: 7.121 log10 IU/mL

## 2022-08-29 LAB — CBC
HCT: 40.5 % (ref 36.0–46.0)
Hemoglobin: 13.6 g/dL (ref 12.0–15.0)
MCH: 32.5 pg (ref 26.0–34.0)
MCHC: 33.6 g/dL (ref 30.0–36.0)
MCV: 96.9 fL (ref 80.0–100.0)
Platelets: 68 10*3/uL — ABNORMAL LOW (ref 150–400)
RBC: 4.18 MIL/uL (ref 3.87–5.11)
RDW: 14.3 % (ref 11.5–15.5)
WBC: 3.8 10*3/uL — ABNORMAL LOW (ref 4.0–10.5)
nRBC: 1 % — ABNORMAL HIGH (ref 0.0–0.2)

## 2022-08-29 LAB — CULTURE, BLOOD (ROUTINE X 2)
Special Requests: ADEQUATE
Special Requests: ADEQUATE

## 2022-08-29 LAB — GLUCOSE, CAPILLARY
Glucose-Capillary: 90 mg/dL (ref 70–99)
Glucose-Capillary: 85 mg/dL (ref 70–99)
Glucose-Capillary: 91 mg/dL (ref 70–99)
Glucose-Capillary: 91 mg/dL (ref 70–99)
Glucose-Capillary: 102 mg/dL — ABNORMAL HIGH (ref 70–99)
Glucose-Capillary: 90 mg/dL (ref 70–99)

## 2022-08-29 LAB — HCV RNA QUANT

## 2022-08-29 LAB — RENAL FUNCTION PANEL
Albumin: 2.1 g/dL — ABNORMAL LOW (ref 3.5–5.0)
Anion gap: 6 (ref 5–15)
BUN: 31 mg/dL — ABNORMAL HIGH (ref 8–23)
CO2: 26 mmol/L (ref 22–32)
Calcium: 7.9 mg/dL — ABNORMAL LOW (ref 8.9–10.3)
Chloride: 109 mmol/L (ref 98–111)
Creatinine, Ser: 0.63 mg/dL (ref 0.44–1.00)
GFR, Estimated: >60 mL/min (ref >60)
Glucose, Bld: 91 mg/dL (ref 70–99)
Phosphorus: 3.6 mg/dL (ref 2.5–4.6)
Potassium: 3.6 mmol/L (ref 3.5–5.1)
Sodium: 141 mmol/L (ref 135–145)

## 2022-08-29 LAB — MAGNESIUM: Magnesium: 2.4 mg/dL (ref 1.7–2.4)

## 2022-08-29 NOTE — Consult Note (Signed)
PHARMACY CONSULT NOTE  Pharmacy Consult for Electrolyte Monitoring and Replacement   Recent Labs: Potassium (mmol/L)  Date Value  08/29/2022 3.6  01/10/2014 3.7   Magnesium (mg/dL)  Date Value  16/12/9602 2.4   Calcium (mg/dL)  Date Value  54/11/8117 7.9 (L)   Calcium, Total (mg/dL)  Date Value  14/78/2956 8.3 (L)   Albumin (g/dL)  Date Value  21/30/8657 2.1 (L)  01/10/2014 3.4   Phosphorus (mg/dL)  Date Value  84/69/6295 3.6   Sodium (mmol/L)  Date Value  08/29/2022 141  01/10/2014 142   Corrected Calcium: 9.42 mg/dL  Assessment: Patient presented with complaint of right sided lower jaw swelling. Side of face and neck are swollen. Pharmacy has been consulted to monitor and replace electrolytes under PCCM care.  Goal of Therapy:  Electrolytes WNL  Plan:  No current replacement indicated F/u with AM labs.   Lowella Bandy ,PharmD Clinical Pharmacist 08/29/2022 10:16 AM

## 2022-08-29 NOTE — Progress Notes (Signed)
NAME:  Sierra Patterson, MRN:  [id number], DOB:  [birth year], LOS: 5 ADMISSION DATE:  08/24/2022, CONSULTATION DATE:  08/24/2022 REFERRING MD:  Dr. Rosalia Hammers, CHIEF COMPLAINT:  Facial swelling   Brief Pt Description / Synopsis:  62 y.o. 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  History of Present Illness:  Sierra Patterson is a 62 year old female with a past medical history significant for COPD, hypertension, cirrhosis due to hepatitis C who presents to [facility] ED on 08/24/2022 due to complaints of right-sided facial swelling.  Patient is currently intubated and sedated and no family is currently available, therefore history is obtained from chart review.  Per ED and nursing notes the patient was working outside in her yard yesterday evening.  She did not notice any bug bites or bee stings, however yesterday been began to notice some swelling over the right side of her face with some neck soreness.  This morning the swelling worsened prompting her to seek evaluation in the ED.  She reported that is now becoming difficult to swallow and feels as if her throat is swelling and starting to close then.  She denied any ACE inhibitor use, fever, chills, rash, chest pain, shortness of breath, vomiting, nausea, vomiting, diarrhea, abdominal pain, dysuria.  ED Course: Initial Vital Signs: Temperature 100.1 F orally, respiratory rate 24, pulse 107, blood pressure 192/98, SpO2 95% on room air Significant Labs: Sodium 130, potassium 3.4, glucose 127, total bilirubin 2.3, lactic acid 1.4, procalcitonin 0.53, WBC 12.1 with neutrophilia COVID-19 PCR is negative Imaging CT soft tissue of the Neck>>IMPRESSION: Supraglottic laryngeal edema worse towards the right with airway narrowing. There is also notable right submandibular space and gland inflammation, the primary infectious/inflammatory  cause is uncertain. No collection. Medications Administered: IV Unasyn, 10 mg Decadron, 1 L LR  On exam she was noted to have swelling along the right lower face into the submandibular area with tenderness to palpation, along with some uvular swelling and asymmetric edema on the right side of the posterior oropharynx.  She did not exhibit any stridor or wheezing, but her voice was hoarse and somewhat muffled, she was able to manage secretions without issue.  She was evaluated by ENT who recommended intubating in the OR for airway protection.  PCCM is asked to admit for further workup and treatment postintubation.  Please see "significant hospital events" section below for full detailed hospital course.  08/29/22- patient for TEE then SBT for extubation  Pertinent  Medical History   Past Medical History:  Diagnosis Date   Cirrhosis (HCC)     Micro Data:  5/27: Blood culture x2>> 4/4 bottles with gram - rods 5/27: COVID-19/RSV/FLU PCR>>negative 5/27: RVP>> negative 5/27: Group A Strep PCR>>negative 5/27: Strep pneumo urinary antigen>>negative   Antimicrobials:   Anti-infectives (From admission, onward)    Start     Dose/Rate Route Frequency Ordered Stop   08/27/22 1330  Ampicillin-Sulbactam (UNASYN) 3 g in sodium chloride 0.9 % 100 mL IVPB        3 g 200 mL/hr over 30 Minutes Intravenous Every 6 hours 08/27/22 1238     08/25/22 1000  piperacillin-tazobactam (ZOSYN) IVPB 3.375 g  Status:  Discontinued        3.375 g 12.5 mL/hr over 240 Minutes Intravenous Every 8 hours 08/25/22 0808 08/27/22 1238   08/24/22 1800  Ampicillin-Sulbactam (UNASYN) 3 g in sodium chloride 0.9 % 100 mL IVPB  Status:  Discontinued        3 g 200 mL/hr over 30 Minutes Intravenous Every 6 hours 08/24/22 1033 08/25/22 0803   08/24/22 0915  Ampicillin-Sulbactam (UNASYN) 3 g in sodium chloride 0.9 % 100 mL IVPB        3 g 200 mL/hr over 30 Minutes Intravenous  Once 08/24/22 0914 08/24/22 1017         Significant Hospital Events: Including procedures, antibiotic start and stop dates in addition to other pertinent events   5/27: Presents to ED with right sided facial and throat swelling.  ENT intubated in OR for airway protection.   PCCM asked to admit 5/28: Blood cultures overnight with Gram - rods.  Will change ABX to Zosyn and consult ID. Obtain Echocardiogram.  Requiring low dose pressors (Levophed 2 mcg) 5/29: No acute events overnight.  Pt remains on minimal vent settings: PEEP 5/FiO2 28%. OGT changed to NGT per ENT recommendations will await ENT's recommendations regarding extubation following ENT airway examination  5/30: Increased peak pressures overnight, resolved with pt repositioning.  Per ENT recommendations CT Soft Tissue Neck with Contrast pending this afternoon to assess airway for possible extubation in the next 24 to 48hrs.  Pt remains on minimal vent settings   08/28/22- patient is with no acute events overnight. Reviewed medical plan with ENT no additional surgical intervention planned. ID with some concern post microbiology pasturella results, patient may need TEE. Cardiology will be consulted for this procedure.   Interim History / Subjective:  As outlined above under significant events  Objective   Blood pressure (!) 91/57, pulse (!) 58, temperature 98.1 F (36.7 C), temperature source Axillary, resp. rate 16, height 5\' 2"  (1.575 m), weight 91.8 kg, SpO2 98 %.    Vent Mode: PRVC FiO2 (%):  [28 %] 28 % Set Rate:  [16 bmp] 16 bmp Vt Set:  [450 mL] 450 mL PEEP:  [5 cmH20] 5 cmH20 Plateau Pressure:  [17 cmH20-20 cmH20] 20 cmH20   Intake/Output Summary (Last 24 hours) at 08/29/2022 1610 Last data filed at 08/29/2022 9604 Gross per 24 hour  Intake 3122.02 ml  Output 957 ml  Net 2165.02 ml    Filed Weights   08/26/22 0448 08/27/22 0500 08/28/22 0500  Weight: 85.9 kg 90.3 kg 91.8 kg    Examination: General: Acute on chronically ill appearing female, laying in  bed, intubated and sedated, in NAD HENT: Atraumatic, normocephalic, neck supple, no JVD Lungs: Faint rhonchi throughout, no stridor, even, non labored  Cardiovascular: NSR, s1s2, no m/r/g, no edema Abdomen: +BS x4, soft, non tender, non distended  Extremities: Normal bulk and tone, no deformities, no edema Neuro: Sedated, withdraws from painful stimulation, PERRL  GU: Purewick in place   Resolved Hospital Problem list     Assessment & Plan:  #Intubated for airway protection in setting of right sided laryngeal and submandibular space edema #COPD without acute exacerbation - Full vent support, implement lung protective strategies - Plateau pressures less than 30 cm H20 - Wean FiO2 & PEEP as tolerated to maintain O2 sats 88 to 92% - Follow intermittent Chest X-ray & ABG as needed - Spontaneous breathing trials when respiratory parameters met, mental status permits, & and with + cuff leak - Repeat CT Soft Tissue Neck with Contrast today to assess submandibular region if improved plan for extubation in the next 24 to 48hrs per ENT  - Implement VAP Bundle - Bronchodilators  #Right sided Laryngeal and Submandibular space Edema, 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  No reported bee sting or bug bites and no associated hives or hypotension to suggest anaphylaxis rxn, does not take ACE-i CT Neck on presentation with supraglottic laryngeal edema (worse on right) with airway narrowing and notable right submandibular space and gland inflammation (but NO abscess collection and uncertain primary infectious/inflammatory etiology) - ENT following, appreciate input ~ recommends 48 hrs of ABX & steroids, and then repeating CT scan at that time - IV Pepcid  - Complement C4 within normal limits/ CRP elevated at 10.3   #Meets SIRS Criteria on presentation (HR >100, RR 22, WBC 12.1) #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  #Submandibular/Peritonsillar Infection & Ludwig's Angina - Monitor fever curve -  Trend WBC's & Procalcitonin - Follow cultures as above - ID consulted appreciate input: continue zosyn for now  - Cardiology consulted for TEE to r/o endocarditis, however will need ENT clearance prior to TEE   #Hypotension: septic shock and sedation related PMHx: HTN Echo 08/25/22: 65 to 70%; pulmonary artery pressure mildly elevated, left atrial size severely dilated; moderate mitral valve regurgitation  - Continuous cardiac monitoring - Maintain MAP >65 - IV fluids - Vasopressors as needed to maintain MAP goal - Lactic acid is normal (1.4) - Hold outpatient metoprolol  #Mild Hyponatremia~resolved  #Mild Hypokalemia~resolved  - Trend BMP  - Replace electrolytes as indicated - Monitor UOP  #Chronic Thrombocytopenia due to Cirrhosis - Trend CBC  - Monitor for s/sx of bleeding  - Lovenox for VTE Prophylaxis  - Transfuse for Hgb <7 - Transfuse platelets for platelet count <50K with active bleeding  #Hyperglycemia due to critical illness exacerbated by high dose steroids Hemoglobin A1c 08/25/22: 5.0 - CBG's q4h; Target range of 140 to 180 - SSI - Follow ICU Hypo/Hyperglycemia protocol  #Sedation needs in the setting of mechanical ventilation - Maintain a RASS goal of -1 to -2 (higher RASS given need to ensure airway protected/maintained) - Fentanyl and Propofol to maintain RASS goal - Avoid sedating medications as able - Daily wake up assessment - Continue outpatient clonazepam  Best Practice (right click and "Reselect all SmartList Selections" daily)   Diet/type: NPO, Continue TF's DVT prophylaxis: LMWH GI prophylaxis: H2B Lines: N/A Foley: N/A Code Status:  full code Last date of multidisciplinary goals of care discussion [08/27/22]   Labs   CBC: Recent Labs  Lab 08/24/22 0810 08/24/22 1122 08/26/22 0724 08/26/22 1028 08/27/22 0450 08/28/22 0658 08/29/22 0259  WBC 12.1*   < > QUESTIONABLE RESULTS, RECOMMEND RECOLLECT TO VERIFY 4.5 3.3* 2.4* 3.8*   NEUTROABS 10.6*  --   --  3.7  --   --   --   HGB 15.6*   < > QUESTIONABLE RESULTS, RECOMMEND RECOLLECT TO VERIFY 11.7* 12.3 11.5* 13.6  HCT 43.9   < > QUESTIONABLE RESULTS, RECOMMEND RECOLLECT TO VERIFY 32.3* 36.2 33.6* 40.5  MCV 91.3   < > QUESTIONABLE RESULTS, RECOMMEND RECOLLECT TO VERIFY 91.5 94.0 95.2 96.9  PLT PLATELET CLUMPS NOTED ON SMEAR, UNABLE TO ESTIMATE   < > QUESTIONABLE RESULTS, RECOMMEND RECOLLECT TO VERIFY 85* 83* 77* 68*   < > = values in this interval not displayed.     Basic Metabolic Panel: Recent Labs  Lab 08/24/22 0810 08/24/22 0810 08/25/22 0547 08/25/22 1017 08/26/22 0724 08/27/22 0450 08/28/22 0658 08/29/22 0259  NA 130*  --  134*  --  135 138 139  --   K 3.4*  --  3.7 3.8 3.8 3.7 3.9  --   CL 99  --  101  --  104 105 107  --   CO2 22  --  23  --  22 24 26   --   GLUCOSE 127*  --  137*  --  126* 144* 113*  --   BUN 11  --  21  --  36* 44* 33*  --   CREATININE 0.78  --  0.81  --  0.92 0.85 0.70  --   CALCIUM 8.8*  --  8.5*  --  8.0* 8.3* 7.9*  --   MG  --    < > 1.8 2.0 2.4 2.5* 2.3 2.4  PHOS  --   --  2.8  --  4.0 4.2 3.2  --    < > = values in this interval not displayed.    GFR: Estimated Creatinine Clearance: 77.9 mL/min (by C-G formula based on SCr of 0.7 mg/dL). Recent Labs  Lab 08/24/22 0810 08/24/22 1122 08/25/22 0546 08/25/22 0547 08/26/22 0724 08/26/22 1028 08/27/22 0450 08/28/22 0658 08/29/22 0259  PROCALCITON 0.53  --  5.40  --  4.67  --  2.23  --   --   WBC 12.1*   < >  --    < > QUESTIONABLE RESULTS, RECOMMEND RECOLLECT TO VERIFY 4.5 3.3* 2.4* 3.8*  LATICACIDVEN 1.4  --   --   --   --   --   --   --   --    < > = values in this interval not displayed.     Liver Function Tests: Recent Labs  Lab 08/24/22 0810 08/25/22 0547 08/26/22 0724 08/27/22 0450 08/28/22 0658  AST 31  --   --   --   --   ALT 21  --   --   --   --   ALKPHOS 67  --   --   --   --   BILITOT 2.3*  --   --   --   --   PROT 9.0*  --   --   --   --    ALBUMIN 3.9 2.8* 2.3* 2.6* 2.3*    No results for input(s): "LIPASE", "AMYLASE" in the last 168 hours. No results for input(s): "AMMONIA" in the last 168 hours.  ABG    Component Value Date/Time   PHART 7.37 08/24/2022 1155   PCO2ART 39 08/24/2022 1155   PO2ART 107 08/24/2022 1155   HCO3 22.5 08/24/2022 1155   ACIDBASEDEF 2.5 (H) 08/24/2022 1155   O2SAT 99.1 08/24/2022 1155     Coagulation Profile: Recent Labs  Lab 08/24/22 0810  INR 1.1     Cardiac Enzymes: No results for input(s): "CKTOTAL", "CKMB", "CKMBINDEX", "TROPONINI" in the last 168 hours.  HbA1C: Hgb A1c MFr Bld  Date/Time Value Ref Range Status  08/25/2022 05:46 AM 5.0 4.8 - 5.6 % Final    Comment:    (NOTE)         Prediabetes: 5.7 - 6.4         Diabetes: >6.4         Glycemic control for adults with diabetes: <7.0     CBG: Recent Labs  Lab 08/28/22 1556 08/28/22 1938 08/28/22 2319 08/29/22 0358 08/29/22 0729  GLUCAP 97 88 79 90 85     Review of Systems:   Unable to assess due to intubation/sedation/critical illness   Past Medical History:  She,  has a past medical history of Cirrhosis (HCC).   Surgical History:   Past Surgical History:  Procedure  Laterality Date   TRACHEOSTOMY TUBE PLACEMENT N/A 08/24/2022   Procedure: NASAL INTUBATION;  Surgeon: Linus Salmons, MD;  Location: [facility] ORS;  Service: ENT;  Laterality: N/A;     Social History:   reports that she has been smoking cigarettes. She has been smoking an average of .5 packs per day. Her smokeless tobacco use includes snuff. She reports current alcohol use. She reports current drug use.   Family History:  Her family history is not on file.   Allergies Allergies  Allergen Reactions   Bee Venom Anaphylaxis and Swelling   Ibuprofen     REACTION: sick on stomach     Home Medications  Prior to Admission medications   Medication Sig Start Date End Date Taking? Authorizing Provider  clonazePAM (KLONOPIN) 0.5 MG tablet  Take 1 tablet by mouth 2 (two) times daily. 08/23/12   [provider]  furosemide (LASIX) 20 MG tablet Take 1 tablet by mouth 2 (two) times daily. 11/25/12   [provider]  gabapentin (NEURONTIN) 300 MG capsule Take 1 capsule by mouth 3 (three) times daily. 08/23/12   [provider]  metoprolol tartrate (LOPRESSOR) 50 MG tablet Take 1 tablet by mouth 2 (two) times daily.  08/23/12   [provider]  naloxone [facility]) nasal spray 4 mg/0.1 mL Administer 1 spray as needed into one nostril in the case of decreased breathing and responsiveness thought to be due to narcotics overdose.  Call 911 and repeat dose every 2 to 3 minutes in alternating nostrils until EMS arrives. 11/18/16   Loleta Rose, MD  OMEPRAZOLE PO Take 1 capsule by mouth 2 (two) times daily.    [provider]  SM MULTIPLE VITAMINS/IRON TABS Take 1 tablet by mouth daily. 08/08/10   [provider]     Critical care provider statement:   Total critical care time: 33 minutes   Performed by: Karna Christmas MD   Critical care time was exclusive of separately billable procedures and treating other patients.   Critical care was necessary to treat or prevent imminent or life-threatening deterioration.   Critical care was time spent personally by me on the following activities: development of treatment plan with patient and/or surrogate as well as nursing, discussions with consultants, evaluation of patient's response to treatment, examination of patient, obtaining history from patient or surrogate, ordering and performing treatments and interventions, ordering and review of laboratory studies, ordering and review of radiographic studies, pulse oximetry and re-evaluation of patient's condition.    Vida Rigger, M.D.  Pulmonary & Critical Care Medicine

## 2022-08-30 LAB — CBC
HCT: 33.1 % — ABNORMAL LOW (ref 36.0–46.0)
Hemoglobin: 10.9 g/dL — ABNORMAL LOW (ref 12.0–15.0)
MCH: 32.3 pg (ref 26.0–34.0)
MCHC: 32.9 g/dL (ref 30.0–36.0)
MCV: 98.2 fL (ref 80.0–100.0)
Platelets: 68 10*3/uL — ABNORMAL LOW (ref 150–400)
RBC: 3.37 MIL/uL — ABNORMAL LOW (ref 3.87–5.11)
RDW: 14.4 % (ref 11.5–15.5)
WBC: 2.6 10*3/uL — ABNORMAL LOW (ref 4.0–10.5)
nRBC: 0 % (ref 0.0–0.2)

## 2022-08-30 LAB — BASIC METABOLIC PANEL
Anion gap: 7 (ref 5–15)
BUN: 24 mg/dL — ABNORMAL HIGH (ref 8–23)
CO2: 27 mmol/L (ref 22–32)
Calcium: 7.9 mg/dL — ABNORMAL LOW (ref 8.9–10.3)
Chloride: 109 mmol/L (ref 98–111)
Creatinine, Ser: 0.59 mg/dL (ref 0.44–1.00)
GFR, Estimated: >60 mL/min (ref >60)
Glucose, Bld: 119 mg/dL — ABNORMAL HIGH (ref 70–99)
Potassium: 3.6 mmol/L (ref 3.5–5.1)
Sodium: 143 mmol/L (ref 135–145)

## 2022-08-30 LAB — MAGNESIUM: Magnesium: 2.1 mg/dL (ref 1.7–2.4)

## 2022-08-30 LAB — GLUCOSE, CAPILLARY
Glucose-Capillary: 101 mg/dL — ABNORMAL HIGH (ref 70–99)
Glucose-Capillary: 109 mg/dL — ABNORMAL HIGH (ref 70–99)
Glucose-Capillary: 93 mg/dL (ref 70–99)
Glucose-Capillary: 82 mg/dL (ref 70–99)
Glucose-Capillary: 88 mg/dL (ref 70–99)

## 2022-08-30 LAB — CULTURE, RESPIRATORY W GRAM STAIN

## 2022-08-30 LAB — CULTURE, BLOOD (ROUTINE X 2)

## 2022-08-30 LAB — PHOSPHORUS: Phosphorus: 3.9 mg/dL (ref 2.5–4.6)

## 2022-08-30 NOTE — Progress Notes (Signed)
NAME:  Sierra Patterson, MRN:  [id number], DOB:  Jul 02, 1960, LOS: 6 ADMISSION DATE:  08/24/2022, CONSULTATION DATE:  08/24/2022 REFERRING MD:  Dr. Rosalia Hammers, CHIEF COMPLAINT:  Facial swelling   Brief Pt Description / Synopsis:  62 y.o. 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  History of Present Illness:  Sierra Patterson is a 62 year old female with a past medical history significant for COPD, hypertension, cirrhosis due to hepatitis C who presents to [facility] ED on 08/24/2022 due to complaints of right-sided facial swelling.  Patient is currently intubated and sedated and no family is currently available, therefore history is obtained from chart review.  Per ED and nursing notes the patient was working outside in her yard yesterday evening.  She did not notice any bug bites or bee stings, however yesterday been began to notice some swelling over the right side of her face with some neck soreness.  This morning the swelling worsened prompting her to seek evaluation in the ED.  She reported that is now becoming difficult to swallow and feels as if her throat is swelling and starting to close then.  She denied any ACE inhibitor use, fever, chills, rash, chest pain, shortness of breath, vomiting, nausea, vomiting, diarrhea, abdominal pain, dysuria.  ED Course: Initial Vital Signs: Temperature 100.1 F orally, respiratory rate 24, pulse 107, blood pressure 192/98, SpO2 95% on room air Significant Labs: Sodium 130, potassium 3.4, glucose 127, total bilirubin 2.3, lactic acid 1.4, procalcitonin 0.53, WBC 12.1 with neutrophilia COVID-19 PCR is negative Imaging CT soft tissue of the Neck>>IMPRESSION: Supraglottic laryngeal edema worse towards the right with airway narrowing. There is also notable right submandibular space and gland inflammation, the primary infectious/inflammatory  cause is uncertain. No collection. Medications Administered: IV Unasyn, 10 mg Decadron, 1 L LR  On exam she was noted to have swelling along the right lower face into the submandibular area with tenderness to palpation, along with some uvular swelling and asymmetric edema on the right side of the posterior oropharynx.  She did not exhibit any stridor or wheezing, but her voice was hoarse and somewhat muffled, she was able to manage secretions without issue.  She was evaluated by ENT who recommended intubating in the OR for airway protection.  PCCM is asked to admit for further workup and treatment postintubation.  Please see "significant hospital events" section below for full detailed hospital course.  08/29/22- patient for TEE then SBT for extubation 08/30/22- patient s/p cardiology evaluation , there is plan for TEE in am will keep patient on MV until this procedure is finished then can likely work on extubation.   Pertinent  Medical History   Past Medical History:  Diagnosis Date   Cirrhosis (HCC)     Micro Data:  5/27: Blood culture x2>> 4/4 bottles with gram - rods 5/27: COVID-19/RSV/FLU PCR>>negative 5/27: RVP>> negative 5/27: Group A Strep PCR>>negative 5/27: Strep pneumo urinary antigen>>negative   Antimicrobials:   Anti-infectives (From admission, onward)    Start     Dose/Rate Route Frequency Ordered Stop   08/27/22 1330  Ampicillin-Sulbactam (UNASYN) 3 g in sodium chloride 0.9 % 100 mL IVPB        3 g 200 mL/hr over 30 Minutes Intravenous Every 6 hours 08/27/22 1238     08/25/22 1000  piperacillin-tazobactam (ZOSYN) IVPB 3.375 g  Status:  Discontinued        3.375 g  12.5 mL/hr over 240 Minutes Intravenous Every 8 hours 08/25/22 0808 08/27/22 1238   08/24/22 1800  Ampicillin-Sulbactam (UNASYN) 3 g in sodium chloride 0.9 % 100 mL IVPB  Status:  Discontinued        3 g 200 mL/hr over 30 Minutes Intravenous Every 6 hours 08/24/22 1033 08/25/22 0803   08/24/22 0915   Ampicillin-Sulbactam (UNASYN) 3 g in sodium chloride 0.9 % 100 mL IVPB        3 g 200 mL/hr over 30 Minutes Intravenous  Once 08/24/22 0914 08/24/22 1017        Significant Hospital Events: Including procedures, antibiotic start and stop dates in addition to other pertinent events   5/27: Presents to ED with right sided facial and throat swelling.  ENT intubated in OR for airway protection.   PCCM asked to admit 5/28: Blood cultures overnight with Gram - rods.  Will change ABX to Zosyn and consult ID. Obtain Echocardiogram.  Requiring low dose pressors (Levophed 2 mcg) 5/29: No acute events overnight.  Pt remains on minimal vent settings: PEEP 5/FiO2 28%. OGT changed to NGT per ENT recommendations will await ENT's recommendations regarding extubation following ENT airway examination  5/30: Increased peak pressures overnight, resolved with pt repositioning.  Per ENT recommendations CT Soft Tissue Neck with Contrast pending this afternoon to assess airway for possible extubation in the next 24 to 48hrs.  Pt remains on minimal vent settings   08/28/22- patient is with no acute events overnight. Reviewed medical plan with ENT no additional surgical intervention planned. ID with some concern post microbiology pasturella results, patient may need TEE. Cardiology will be consulted for this procedure.   Interim History / Subjective:  As outlined above under significant events  Objective   Blood pressure 98/66, pulse 63, temperature 98.8 F (37.1 C), temperature source Axillary, resp. rate 16, height 5\' 2"  (1.575 m), weight 91.9 kg, SpO2 95 %.    Vent Mode: PRVC FiO2 (%):  [28 %] 28 % Set Rate:  [16 bmp] 16 bmp Vt Set:  [50 mL-450 mL] 50 mL PEEP:  [5 cmH20] 5 cmH20 Plateau Pressure:  [17 cmH20-24 cmH20] 24 cmH20   Intake/Output Summary (Last 24 hours) at 08/30/2022 1013 Last data filed at 08/30/2022 0600 Gross per 24 hour  Intake 939.05 ml  Output 1270 ml  Net -330.95 ml    Filed Weights    08/27/22 0500 08/28/22 0500 08/30/22 0415  Weight: 90.3 kg 91.8 kg 91.9 kg    Examination: General: Acute on chronically ill appearing female, laying in bed, intubated and sedated, in NAD HENT: Atraumatic, normocephalic, neck supple, no JVD Lungs: Faint rhonchi throughout, no stridor, even, non labored  Cardiovascular: NSR, s1s2, no m/r/g, no edema Abdomen: +BS x4, soft, non tender, non distended  Extremities: Normal bulk and tone, no deformities, no edema Neuro: Sedated, withdraws from painful stimulation, PERRL  GU: Purewick in place   Resolved Hospital Problem list     Assessment & Plan:  #Intubated for airway protection in setting of right sided laryngeal and submandibular space edema #COPD without acute exacerbation - Full vent support, implement lung protective strategies - Plateau pressures less than 30 cm H20 - Wean FiO2 & PEEP as tolerated to maintain O2 sats 88 to 92% - Follow intermittent Chest X-ray & ABG as needed - Spontaneous breathing trials when respiratory parameters met, mental status permits, & and with + cuff leak - Repeat CT Soft Tissue Neck with Contrast today to assess submandibular region if  improved plan for extubation in the next 24 to 48hrs per ENT  - Implement VAP Bundle - Bronchodilators  #Right sided Laryngeal and Submandibular space Edema, 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 No reported bee sting or bug bites and no associated hives or hypotension to suggest anaphylaxis rxn, does not take ACE-i CT Neck on presentation with supraglottic laryngeal edema (worse on right) with airway narrowing and notable right submandibular space and gland inflammation (but NO abscess collection and uncertain primary infectious/inflammatory etiology) - ENT following, appreciate input ~ recommends 48 hrs of ABX & steroids, and then repeating CT scan at that time - IV Pepcid  - Complement C4 within normal limits/ CRP elevated at 10.3   #Meets SIRS Criteria on presentation (HR >100,  RR 22, WBC 12.1) #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  #Submandibular/Peritonsillar Infection & Ludwig's Angina - Monitor fever curve - Trend WBC's & Procalcitonin - Follow cultures as above - ID consulted appreciate input: continue zosyn for now  - Cardiology consulted for TEE to r/o endocarditis, however will need ENT clearance prior to TEE   #Hypotension: septic shock and sedation related PMHx: HTN Echo 08/25/22: 65 to 70%; pulmonary artery pressure mildly elevated, left atrial size severely dilated; moderate mitral valve regurgitation  - Continuous cardiac monitoring - Maintain MAP >65 - IV fluids - Vasopressors as needed to maintain MAP goal - Lactic acid is normal (1.4) - Hold outpatient metoprolol  #Mild Hyponatremia~resolved  #Mild Hypokalemia~resolved  - Trend BMP  - Replace electrolytes as indicated - Monitor UOP  #Chronic Thrombocytopenia due to Cirrhosis - Trend CBC  - Monitor for s/sx of bleeding  - Lovenox for VTE Prophylaxis  - Transfuse for Hgb <7 - Transfuse platelets for platelet count <50K with active bleeding  #Hyperglycemia due to critical illness exacerbated by high dose steroids Hemoglobin A1c 08/25/22: 5.0 - CBG's q4h; Target range of 140 to 180 - SSI - Follow ICU Hypo/Hyperglycemia protocol  #Sedation needs in the setting of mechanical ventilation - Maintain a RASS goal of -1 to -2 (higher RASS given need to ensure airway protected/maintained) - Fentanyl and Propofol to maintain RASS goal - Avoid sedating medications as able - Daily wake up assessment - Continue outpatient clonazepam  Best Practice (right click and "Reselect all SmartList Selections" daily)   Diet/type: NPO, Continue TF's DVT prophylaxis: LMWH GI prophylaxis: H2B Lines: N/A Foley: N/A Code Status:  full code Last date of multidisciplinary goals of care discussion [08/27/22]   Labs   CBC: Recent Labs  Lab 08/24/22 0810 08/24/22 1122 08/26/22 1028  08/27/22 0450 08/28/22 0658 08/29/22 0259 08/30/22 0700  WBC 12.1*   < > 4.5 3.3* 2.4* 3.8* 2.6*  NEUTROABS 10.6*  --  3.7  --   --   --   --   HGB 15.6*   < > 11.7* 12.3 11.5* 13.6 10.9*  HCT 43.9   < > 32.3* 36.2 33.6* 40.5 33.1*  MCV 91.3   < > 91.5 94.0 95.2 96.9 98.2  PLT PLATELET CLUMPS NOTED ON SMEAR, UNABLE TO ESTIMATE   < > 85* 83* 77* 68* 68*   < > = values in this interval not displayed.     Basic Metabolic Panel: Recent Labs  Lab 08/26/22 0724 08/27/22 0450 08/28/22 0658 08/29/22 0259 08/29/22 0544 08/30/22 0700  NA 135 138 139  --  141 143  K 3.8 3.7 3.9  --  3.6 3.6  CL 104 105 107  --  109 109  CO2 22 24 26   --  26 27  GLUCOSE 126* 144* 113*  --  91 119*  BUN 36* 44* 33*  --  31* 24*  CREATININE 0.92 0.85 0.70  --  0.63 0.59  CALCIUM 8.0* 8.3* 7.9*  --  7.9* 7.9*  MG 2.4 2.5* 2.3 2.4  --  2.1  PHOS 4.0 4.2 3.2  --  3.6 3.9    GFR: Estimated Creatinine Clearance: 77.9 mL/min (by C-G formula based on SCr of 0.59 mg/dL). Recent Labs  Lab 08/24/22 0810 08/24/22 1122 08/25/22 0546 08/25/22 0547 08/26/22 0724 08/26/22 1028 08/27/22 0450 08/28/22 0658 08/29/22 0259 08/30/22 0700  PROCALCITON 0.53  --  5.40  --  4.67  --  2.23  --   --   --   WBC 12.1*   < >  --    < > QUESTIONABLE RESULTS, RECOMMEND RECOLLECT TO VERIFY   < > 3.3* 2.4* 3.8* 2.6*  LATICACIDVEN 1.4  --   --   --   --   --   --   --   --   --    < > = values in this interval not displayed.     Liver Function Tests: Recent Labs  Lab 08/24/22 0810 08/25/22 0547 08/26/22 0724 08/27/22 0450 08/28/22 0658 08/29/22 0544  AST 31  --   --   --   --   --   ALT 21  --   --   --   --   --   ALKPHOS 67  --   --   --   --   --   BILITOT 2.3*  --   --   --   --   --   PROT 9.0*  --   --   --   --   --   ALBUMIN 3.9 2.8* 2.3* 2.6* 2.3* 2.1*    No results for input(s): "LIPASE", "AMYLASE" in the last 168 hours. No results for input(s): "AMMONIA" in the last 168 hours.  ABG     Component Value Date/Time   PHART 7.37 08/24/2022 1155   PCO2ART 39 08/24/2022 1155   PO2ART 107 08/24/2022 1155   HCO3 22.5 08/24/2022 1155   ACIDBASEDEF 2.5 (H) 08/24/2022 1155   O2SAT 99.1 08/24/2022 1155     Coagulation Profile: Recent Labs  Lab 08/24/22 0810  INR 1.1     Cardiac Enzymes: No results for input(s): "CKTOTAL", "CKMB", "CKMBINDEX", "TROPONINI" in the last 168 hours.  HbA1C: Hgb A1c MFr Bld  Date/Time Value Ref Range Status  08/25/2022 05:46 AM 5.0 4.8 - 5.6 % Final    Comment:    (NOTE)         Prediabetes: 5.7 - 6.4         Diabetes: >6.4         Glycemic control for adults with diabetes: <7.0     CBG: Recent Labs  Lab 08/29/22 1550 08/29/22 2003 08/29/22 2309 08/30/22 0334 08/30/22 0744  GLUCAP 91 102* 90 101* 109*     Review of Systems:   Unable to assess due to intubation/sedation/critical illness   Past Medical History:  She,  has a past medical history of Cirrhosis (HCC).   Surgical History:   Past Surgical History:  Procedure Laterality Date   TRACHEOSTOMY TUBE PLACEMENT N/A 08/24/2022   Procedure: NASAL INTUBATION;  Surgeon: Linus Salmons, MD;  Location: [facility] ORS;  Service: ENT;  Laterality: N/A;     Social History:   reports that she has been  smoking cigarettes. She has been smoking an average of .5 packs per day. Her smokeless tobacco use includes snuff. She reports current alcohol use. She reports current drug use.   Family History:  Her family history is not on file.   Allergies Allergies  Allergen Reactions   Bee Venom Anaphylaxis and Swelling   Ibuprofen     REACTION: sick on stomach     Home Medications  Prior to Admission medications   Medication Sig Start Date End Date Taking? Authorizing Provider  clonazePAM (KLONOPIN) 0.5 MG tablet Take 1 tablet by mouth 2 (two) times daily. 08/23/12   [provider]  furosemide (LASIX) 20 MG tablet Take 1 tablet by mouth 2 (two) times daily. 11/25/12    [provider]  gabapentin (NEURONTIN) 300 MG capsule Take 1 capsule by mouth 3 (three) times daily. 08/23/12   [provider]  metoprolol tartrate (LOPRESSOR) 50 MG tablet Take 1 tablet by mouth 2 (two) times daily.  08/23/12   [provider]  naloxone [facility]) nasal spray 4 mg/0.1 mL Administer 1 spray as needed into one nostril in the case of decreased breathing and responsiveness thought to be due to narcotics overdose.  Call 911 and repeat dose every 2 to 3 minutes in alternating nostrils until EMS arrives. 11/18/16   Loleta Rose, MD  OMEPRAZOLE PO Take 1 capsule by mouth 2 (two) times daily.    [provider]  SM MULTIPLE VITAMINS/IRON TABS Take 1 tablet by mouth daily. 08/08/10   [provider]     Critical care provider statement:   Total critical care time: 33 minutes   Performed by: Karna Christmas MD   Critical care time was exclusive of separately billable procedures and treating other patients.   Critical care was necessary to treat or prevent imminent or life-threatening deterioration.   Critical care was time spent personally by me on the following activities: development of treatment plan with patient and/or surrogate as well as nursing, discussions with consultants, evaluation of patient's response to treatment, examination of patient, obtaining history from patient or surrogate, ordering and performing treatments and interventions, ordering and review of laboratory studies, ordering and review of radiographic studies, pulse oximetry and re-evaluation of patient's condition.    Vida Rigger, M.D.  Pulmonary & Critical Care Medicine

## 2022-08-30 NOTE — Consult Note (Signed)
PHARMACY CONSULT NOTE  Pharmacy Consult for Electrolyte Monitoring and Replacement   Recent Labs: Potassium (mmol/L)  Date Value  08/30/2022 3.6  01/10/2014 3.7   Magnesium (mg/dL)  Date Value  29/56/2130 2.1   Calcium (mg/dL)  Date Value  86/57/8469 7.9 (L)   Calcium, Total (mg/dL)  Date Value  62/95/2841 8.3 (L)   Albumin (g/dL)  Date Value  32/44/0102 2.1 (L)  01/10/2014 3.4   Phosphorus (mg/dL)  Date Value  72/53/6644 3.9   Sodium (mmol/L)  Date Value  08/30/2022 143  01/10/2014 142   Corrected Calcium: 9.42 mg/dL  Assessment: Patient presented with complaint of right sided lower jaw swelling. Side of face and neck are swollen. Pharmacy has been consulted to monitor and replace electrolytes under PCCM care.  Goal of Therapy:  Electrolytes WNL  Plan:  No current replacement indicated F/u with AM labs.   Lowella Bandy ,PharmD Clinical Pharmacist 08/30/2022 7:38 AM

## 2022-08-30 NOTE — Progress Notes (Signed)
   [facility] has been requested to perform a transesophageal echocardiogram on Sierra Patterson for bacteermia.  After careful review of history and examination, the risks and benefits of transesophageal echocardiogram have been explained including risks of esophageal damage, perforation (1:10,000 risk), bleeding, pharyngeal hematoma as well as other potential complications associated with conscious sedation including aspiration, arrhythmia, respiratory failure and death. Alternatives to treatment were discussed, questions were answered. Given the patient's current mental statin, the patient's father, Tiana Loft, was contacted via phone. The procedure, Risks, and benefits, were explained and patient's father is OK to proceed.   Kalden Wanke David Stall, PA-C  08/30/2022 10:15 AM

## 2022-08-31 ENCOUNTER — Inpatient Hospital Stay: Payer: Medicare Other

## 2022-08-31 DIAGNOSIS — F1510 Other stimulant abuse, uncomplicated: Secondary | ICD-10-CM

## 2022-08-31 DIAGNOSIS — B1910 Unspecified viral hepatitis B without hepatic coma: Secondary | ICD-10-CM

## 2022-08-31 LAB — TRIGLYCERIDES: Triglycerides: 86 mg/dL (ref <150)

## 2022-08-31 LAB — CBC
HCT: 30.7 % — ABNORMAL LOW (ref 36.0–46.0)
Hemoglobin: 10.2 g/dL — ABNORMAL LOW (ref 12.0–15.0)
MCH: 32.5 pg (ref 26.0–34.0)
MCHC: 33.2 g/dL (ref 30.0–36.0)
MCV: 97.8 fL (ref 80.0–100.0)
Platelets: 71 10*3/uL — ABNORMAL LOW (ref 150–400)
RBC: 3.14 MIL/uL — ABNORMAL LOW (ref 3.87–5.11)
RDW: 14.6 % (ref 11.5–15.5)
WBC: 3.2 10*3/uL — ABNORMAL LOW (ref 4.0–10.5)
nRBC: 0 % (ref 0.0–0.2)

## 2022-08-31 LAB — BASIC METABOLIC PANEL
Anion gap: 6 (ref 5–15)
BUN: 22 mg/dL (ref 8–23)
CO2: 28 mmol/L (ref 22–32)
Calcium: 7.5 mg/dL — ABNORMAL LOW (ref 8.9–10.3)
Chloride: 109 mmol/L (ref 98–111)
Creatinine, Ser: 0.58 mg/dL (ref 0.44–1.00)
GFR, Estimated: >60 mL/min (ref >60)
Glucose, Bld: 118 mg/dL — ABNORMAL HIGH (ref 70–99)
Potassium: 3.9 mmol/L (ref 3.5–5.1)
Sodium: 143 mmol/L (ref 135–145)

## 2022-08-31 LAB — PHOSPHORUS: Phosphorus: 3.7 mg/dL (ref 2.5–4.6)

## 2022-08-31 LAB — GLUCOSE, CAPILLARY
Glucose-Capillary: 105 mg/dL — ABNORMAL HIGH (ref 70–99)
Glucose-Capillary: 74 mg/dL (ref 70–99)
Glucose-Capillary: 79 mg/dL (ref 70–99)
Glucose-Capillary: 89 mg/dL (ref 70–99)
Glucose-Capillary: 92 mg/dL (ref 70–99)
Glucose-Capillary: 95 mg/dL (ref 70–99)
Glucose-Capillary: 96 mg/dL (ref 70–99)

## 2022-08-31 LAB — CULTURE, BLOOD (ROUTINE X 2)

## 2022-08-31 LAB — MAGNESIUM: Magnesium: 2.1 mg/dL (ref 1.7–2.4)

## 2022-08-31 LAB — CULTURE, RESPIRATORY W GRAM STAIN: Gram Stain: NONE SEEN

## 2022-08-31 MED ORDER — SODIUM CHLORIDE 0.9 % IV SOLN: INTRAVENOUS | Status: DC

## 2022-08-31 NOTE — Consult Note (Signed)
PHARMACY CONSULT NOTE  Pharmacy Consult for Electrolyte Monitoring and Replacement   Recent Labs: Potassium (mmol/L)  Date Value  08/31/2022 3.9  01/10/2014 3.7   Magnesium (mg/dL)  Date Value  56/43/3295 2.1   Calcium (mg/dL)  Date Value  18/84/1660 7.5 (L)   Calcium, Total (mg/dL)  Date Value  63/03/6008 8.3 (L)   Albumin (g/dL)  Date Value  93/23/5573 2.1 (L)  01/10/2014 3.4   Phosphorus (mg/dL)  Date Value  22/04/5425 3.7   Sodium (mmol/L)  Date Value  08/31/2022 143  01/10/2014 142   Corrected Calcium: 8.62 mg/dL  Assessment: Patient presented with complaint of right sided lower jaw swelling. Side of face and neck are swollen. Pharmacy has been consulted to monitor and replace electrolytes under PCCM care.  Goal of Therapy:  Electrolytes WNL  Plan:  No current replacement indicated F/u with AM labs.   Elliot Gurney, PharmD, BCPS Clinical Pharmacist  08/31/2022 7:34 AM

## 2022-08-31 NOTE — Progress Notes (Signed)
NAME:  Sierra Patterson, MRN:  [id number], DOB:  [birth year], LOS: 7 ADMISSION DATE:  08/24/2022, CONSULTATION DATE:  08/24/2022 REFERRING MD:  Dr. Rosalia Hammers, CHIEF COMPLAINT:  Facial swelling   Brief Pt Description / Synopsis:  62 y.o. 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  History of Present Illness:  Sierra Patterson is a 62 year old female with a past medical history significant for COPD, hypertension, cirrhosis due to hepatitis C who presents to [facility] ED on 08/24/2022 due to complaints of right-sided facial swelling.  Patient is currently intubated and sedated and no family is currently available, therefore history is obtained from chart review.  Per ED and nursing notes the patient was working outside in her yard yesterday evening.  She did not notice any bug bites or bee stings, however yesterday been began to notice some swelling over the right side of her face with some neck soreness.  This morning the swelling worsened prompting her to seek evaluation in the ED.  She reported that is now becoming difficult to swallow and feels as if her throat is swelling and starting to close then.  She denied any ACE inhibitor use, fever, chills, rash, chest pain, shortness of breath, vomiting, nausea, vomiting, diarrhea, abdominal pain, dysuria.  ED Course: Initial Vital Signs: Temperature 100.1 F orally, respiratory rate 24, pulse 107, blood pressure 192/98, SpO2 95% on room air Significant Labs: Sodium 130, potassium 3.4, glucose 127, total bilirubin 2.3, lactic acid 1.4, procalcitonin 0.53, WBC 12.1 with neutrophilia COVID-19 PCR is negative Imaging CT soft tissue of the Neck>>IMPRESSION: Supraglottic laryngeal edema worse towards the right with airway narrowing. There is also notable right submandibular space and gland inflammation, the primary infectious/inflammatory  cause is uncertain. No collection. Medications Administered: IV Unasyn, 10 mg Decadron, 1 L LR  On exam she was noted to have swelling along the right lower face into the submandibular area with tenderness to palpation, along with some uvular swelling and asymmetric edema on the right side of the posterior oropharynx.  She did not exhibit any stridor or wheezing, but her voice was hoarse and somewhat muffled, she was able to manage secretions without issue.  She was evaluated by ENT who recommended intubating in the OR for airway protection.  PCCM is asked to admit for further workup and treatment postintubation.  Please see "significant hospital events" section below for full detailed hospital course.   Pertinent  Medical History   Past Medical History:  Diagnosis Date   Cirrhosis (HCC)     Micro Data:  5/27: Blood culture x2>> 4/4 bottles with gram - rods PASTURELLA SPECIES 5/27: COVID-19/RSV/FLU PCR>>negative 5/27: RVP>> negative 5/27: Group A Strep PCR>>negative 5/27: Strep pneumo urinary antigen>>negative   Antimicrobials:   Anti-infectives (From admission, onward)    Start     Dose/Rate Route Frequency Ordered Stop   08/27/22 1330  Ampicillin-Sulbactam (UNASYN) 3 g in sodium chloride 0.9 % 100 mL IVPB        3 g 200 mL/hr over 30 Minutes Intravenous Every 6 hours 08/27/22 1238     08/25/22 1000  piperacillin-tazobactam (ZOSYN) IVPB 3.375 g  Status:  Discontinued        3.375 g 12.5 mL/hr over 240 Minutes Intravenous Every 8 hours 08/25/22 0808 08/27/22 1238   08/24/22 1800  Ampicillin-Sulbactam (UNASYN) 3 g in sodium chloride 0.9 % 100 mL IVPB  Status:  Discontinued  3 g 200 mL/hr over 30 Minutes Intravenous Every 6 hours 08/24/22 1033 08/25/22 0803   08/24/22 0915  Ampicillin-Sulbactam (UNASYN) 3 g in sodium chloride 0.9 % 100 mL IVPB        3 g 200 mL/hr over 30 Minutes Intravenous  Once 08/24/22 0914 08/24/22 1017        Significant Hospital  Events: Including procedures, antibiotic start and stop dates in addition to other pertinent events   5/27: Presents to ED with right sided facial and throat swelling.  ENT intubated in OR for airway protection.   PCCM asked to admit+PASTURELLA SPECIES 5/28: Blood cultures overnight with Gram - rods.  Will change ABX to Zosyn and consult ID. Obtain Echocardiogram.  Requiring low dose pressors (Levophed 2 mcg) 5/29: No acute events overnight.  Pt remains on minimal vent settings: PEEP 5/FiO2 28%. OGT changed to NGT per ENT recommendations will await ENT's recommendations regarding extubation following ENT airway examination  5/30: Increased peak pressures overnight, resolved with pt repositioning.  Per ENT recommendations CT Soft Tissue Neck with Contrast pending this afternoon to assess airway for possible extubation in the next 24 to 48hrs.  Pt remains on minimal vent settings   08/28/22- patient is with no acute events overnight. Reviewed medical plan with ENT no additional surgical intervention planned. ID with some concern post microbiology pasturella results, patient may need TEE. Cardiology will be consulted for this procedure.  08/30/22- patient s/p cardiology evaluation , there is plan for TEE in am will keep patient on MV until this procedure is finished then can likely work on extubation.  6/3 remains nasally intubated plan for TEE   Interim History / Subjective:  Remains intubated  Vent Mode: PRVC FiO2 (%):  [28 %] 28 % Set Rate:  [16 bmp] 16 bmp Vt Set:  [450 mL] 450 mL PEEP:  [5 cmH20] 5 cmH20 Plateau Pressure:  [2 cmH20-24 cmH20] 22 cmH20   Objective   Blood pressure 137/84, pulse 66, temperature 98.6 F (37 C), temperature source Axillary, resp. rate 16, height 5\' 2"  (1.575 m), weight 91.9 kg, SpO2 97 %.    Vent Mode: PRVC FiO2 (%):  [28 %] 28 % Set Rate:  [16 bmp] 16 bmp Vt Set:  [450 mL] 450 mL PEEP:  [5 cmH20] 5 cmH20 Plateau Pressure:  [2 cmH20-24 cmH20] 22 cmH20    Intake/Output Summary (Last 24 hours) at 08/31/2022 0745 Last data filed at 08/31/2022 0451 Gross per 24 hour  Intake 2643.79 ml  Output 920 ml  Net 1723.79 ml    Filed Weights   08/27/22 0500 08/28/22 0500 08/30/22 0415  Weight: 90.3 kg 91.8 kg 91.9 kg      REVIEW OF SYSTEMS  PATIENT IS UNABLE TO PROVIDE COMPLETE REVIEW OF SYSTEMS DUE TO SEVERE CRITICAL ILLNESS   PHYSICAL EXAMINATION:  GENERAL:critically ill appearing nasally intubated EYES: Pupils equal, round, reactive to light.  No scleral icterus.  MOUTH: Moist mucosal membrane. NECK: Supple.  PULMONARY: Lungs clear to auscultation, +rhonchi, +wheezing CARDIOVASCULAR: S1 and S2.  Regular rate and rhythm GASTROINTESTINAL: Soft, nontender, -distended. Positive bowel sounds.  MUSCULOSKELETAL: No swelling, clubbing, or edema.  NEUROLOGIC: obtunded,sedated SKIN:normal, warm to touch, Capillary refill delayed  Pulses present bilaterally        Assessment & Plan:  #Intubated for airway protection in setting of right sided laryngeal and submandibular space edema #COPD without acute exacerbation  Severe ACUTE Hypoxic and Hypercapnic Respiratory Failure -continue Mechanical Ventilator support -Wean Fio2 and PEEP as  tolerated -VAP/VENT bundle implementation - Wean PEEP & FiO2 as tolerated, maintain SpO2 > 88% - Head of bed elevated 30 degrees, VAP protocol in place - Plateau pressures less than 30 cm H20  - Intermittent chest x-ray & ABG PRN - Ensure adequate pulmonary hygiene  -will perform SAT/SBT when respiratory parameters are met once TEE has been completed and ENT has evaluated the patient   #Right sided Laryngeal and Submandibular space Edema, 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 No reported bee sting or bug bites and no associated hives or hypotension to suggest anaphylaxis rxn, does not take ACE-i CT Neck on presentation with supraglottic laryngeal edema (worse on right) with airway narrowing and notable right  submandibular space and gland inflammation (but NO abscess collection and uncertain primary infectious/inflammatory etiology) - IV Pepcid  IV steroids    #Meets SIRS Criteria on presentation (HR >100, RR 22, WBC 12.1) #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  #Submandibular/Peritonsillar Infection & Ludwig's Angina - Monitor fever curve - Trend WBC's & Procalcitonin - Follow cultures as above - ID consulted appreciate input: continue zosyn for now  - Cardiology consulted for TEE to r/o endocarditis, however will need ENT clearance prior to TEE    SEPTIC shock SOURCE-bacteremia -use vasopressors to keep MAP>65 as needed  ABX   #Chronic Thrombocytopenia due to Cirrhosis - Trend CBC  - Monitor for s/sx of bleeding  - Lovenox for VTE Prophylaxis  - Transfuse for Hgb <7 - Transfuse platelets for platelet count <50K with active bleeding   NEUROLOGY ACUTE METABOLIC ENCEPHALOPATHY -need for sedation -Goal RASS -2 to -3   ENDO - ICU hypoglycemic\Hyperglycemia protocol -check FSBS per protocol Hyperglycemia due to critical illness exacerbated by high dose steroids Hemoglobin A1c 08/25/22: 5.0 - CBG's q4h; Target range of 140 to 180  GI GI PROPHYLAXIS as indicated  NUTRITIONAL STATUS DIET-->TF's as tolerated Constipation protocol as indicated   ELECTROLYTES -follow labs as needed -replace as needed -pharmacy consultation and following   Best Practice (right click and "Reselect all SmartList Selections" daily)   Diet/type: NPO, Continue TF's DVT prophylaxis: LMWH GI prophylaxis: H2B Lines: N/A Foley: N/A Code Status:  full code Last date of multidisciplinary goals of care discussion [08/27/22]   Labs   CBC: Recent Labs  Lab 08/24/22 0810 08/24/22 1122 08/26/22 1028 08/27/22 0450 08/28/22 0658 08/29/22 0259 08/30/22 0700 08/31/22 0338  WBC 12.1*   < > 4.5 3.3* 2.4* 3.8* 2.6* 3.2*  NEUTROABS 10.6*  --  3.7  --   --   --   --   --   HGB  15.6*   < > 11.7* 12.3 11.5* 13.6 10.9* 10.2*  HCT 43.9   < > 32.3* 36.2 33.6* 40.5 33.1* 30.7*  MCV 91.3   < > 91.5 94.0 95.2 96.9 98.2 97.8  PLT PLATELET CLUMPS NOTED ON SMEAR, UNABLE TO ESTIMATE   < > 85* 83* 77* 68* 68* 71*   < > = values in this interval not displayed.     Basic Metabolic Panel: Recent Labs  Lab 08/27/22 0450 08/28/22 0658 08/29/22 0259 08/29/22 0544 08/30/22 0700 08/31/22 0338  NA 138 139  --  141 143 143  K 3.7 3.9  --  3.6 3.6 3.9  CL 105 107  --  109 109 109  CO2 24 26  --  26 27 28   GLUCOSE 144* 113*  --  91 119* 118*  BUN 44* 33*  --  31* 24* 22  CREATININE 0.85 0.70  --  0.63 0.59 0.58  CALCIUM 8.3* 7.9*  --  7.9* 7.9* 7.5*  MG 2.5* 2.3 2.4  --  2.1 2.1  PHOS 4.2 3.2  --  3.6 3.9 3.7    GFR: Estimated Creatinine Clearance: 77.9 mL/min (by C-G formula based on SCr of 0.58 mg/dL). Recent Labs  Lab 08/24/22 0810 08/24/22 1122 08/25/22 0546 08/25/22 0547 08/26/22 0724 08/26/22 1028 08/27/22 0450 08/28/22 0658 08/29/22 0259 08/30/22 0700 08/31/22 0338  PROCALCITON 0.53  --  5.40  --  4.67  --  2.23  --   --   --   --   WBC 12.1*   < >  --    < > QUESTIONABLE RESULTS, RECOMMEND RECOLLECT TO VERIFY   < > 3.3* 2.4* 3.8* 2.6* 3.2*  LATICACIDVEN 1.4  --   --   --   --   --   --   --   --   --   --    < > = values in this interval not displayed.     Liver Function Tests: Recent Labs  Lab 08/24/22 0810 08/25/22 0547 08/26/22 0724 08/27/22 0450 08/28/22 0658 08/29/22 0544  AST 31  --   --   --   --   --   ALT 21  --   --   --   --   --   ALKPHOS 67  --   --   --   --   --   BILITOT 2.3*  --   --   --   --   --   PROT 9.0*  --   --   --   --   --   ALBUMIN 3.9 2.8* 2.3* 2.6* 2.3* 2.1*    No results for input(s): "LIPASE", "AMYLASE" in the last 168 hours. No results for input(s): "AMMONIA" in the last 168 hours.  ABG    Component Value Date/Time   PHART 7.37 08/24/2022 1155   PCO2ART 39 08/24/2022 1155   PO2ART 107 08/24/2022  1155   HCO3 22.5 08/24/2022 1155   ACIDBASEDEF 2.5 (H) 08/24/2022 1155   O2SAT 99.1 08/24/2022 1155     Coagulation Profile: Recent Labs  Lab 08/24/22 0810  INR 1.1     Cardiac Enzymes: No results for input(s): "CKTOTAL", "CKMB", "CKMBINDEX", "TROPONINI" in the last 168 hours.  HbA1C: Hgb A1c MFr Bld  Date/Time Value Ref Range Status  08/25/2022 05:46 AM 5.0 4.8 - 5.6 % Final    Comment:    (NOTE)         Prediabetes: 5.7 - 6.4         Diabetes: >6.4         Glycemic control for adults with diabetes: <7.0       DVT/GI PRX  assessed I Assessed the need for Labs I Assessed the need for Foley I Assessed the need for Central Venous Line Family Discussion when available I Assessed the need for Mobilization I made an Assessment of medications to be adjusted accordingly Safety Risk assessment completed  CASE DISCUSSED IN MULTIDISCIPLINARY ROUNDS WITH ICU TEAM     Critical Care Time devoted to patient care services described in this note is 55 minutes.  Critical care was necessary to treat /prevent imminent and life-threatening deterioration.   Lucie Leather, M.D.  [facility]  Medical Director [facility] Medical Director [facility] Cardio-Pulmonary Department

## 2022-08-31 NOTE — Progress Notes (Signed)
Date of Admission:  08/24/2022    ID: Sierra Patterson is a 62 y.o. female Principal Problem:   Laryngeal edema Active Problems:   Acute hypoxic respiratory failure (HCC)   Ludwig's angina   Sepsis (HCC)   Gram-negative bacteremia    Subjective: Remains intubated and sedated No change in her status She will get TEE tomorrow  Medications:   Chlorhexidine Gluconate Cloth  6 each Topical Nightly   clonazepam  0.5 mg Per Tube BID   docusate  100 mg Per Tube BID   enoxaparin (LOVENOX) injection  40 mg Subcutaneous Q24H   feeding supplement (PROSource TF20)  60 mL Per Tube Daily   free water  30 mL Per Tube Q4H   insulin aspart  0-15 Units Subcutaneous Q4H   mouth rinse  15 mL Mouth Rinse Q2H   polyethylene glycol  17 g Per Tube Daily    Objective: Vital signs in last 24 hours: Patient Vitals for the past 24 hrs:  BP Temp Temp src Pulse Resp SpO2  08/31/22 1200 134/72 -- -- 66 16 96 %  08/31/22 1139 -- -- -- 70 16 95 %  08/31/22 1109 -- -- -- 75 14 96 %  08/31/22 1100 (!) 152/79 -- -- 71 16 95 %  08/31/22 1000 (!) 156/88 -- -- 69 16 96 %  08/31/22 0900 (!) 162/88 -- -- 72 16 97 %  08/31/22 0805 -- 98.8 F (37.1 C) Axillary 71 16 98 %  08/31/22 0800 (!) 147/86 -- -- 72 16 98 %  08/31/22 0749 -- -- -- -- -- 98 %  08/31/22 0700 137/84 -- -- 66 16 97 %  08/31/22 0630 (!) 141/82 -- -- 68 16 98 %  08/31/22 0600 136/83 -- -- 65 16 98 %  08/31/22 0530 124/77 -- -- 63 16 96 %  08/31/22 0500 115/71 -- -- 64 16 95 %  08/31/22 0459 -- -- -- -- -- 96 %  08/31/22 0430 107/67 -- -- 64 16 94 %  08/31/22 0400 109/69 -- -- 62 16 94 %  08/31/22 0330 101/61 -- -- 65 15 95 %  08/31/22 0300 109/70 -- -- 65 16 97 %  08/31/22 0230 107/71 -- -- 63 16 96 %  08/31/22 0200 98/64 -- -- 63 16 95 %  08/31/22 0130 98/62 -- -- 65 16 96 %  08/31/22 0100 93/60 -- -- 65 16 95 %  08/31/22 0030 97/65 -- -- 66 16 97 %  08/31/22 0000 110/73 98.6 F (37 C) Axillary 64 16 99 %  08/30/22 2334 -- -- -- --  -- 94 %  08/30/22 2330 96/60 -- -- 65 16 94 %  08/30/22 2300 95/62 -- -- 67 16 93 %  08/30/22 2230 100/65 -- -- 72 16 94 %  08/30/22 2200 125/79 -- -- 73 16 97 %  08/30/22 2145 (!) 143/90 -- -- 80 16 99 %  08/30/22 2100 115/69 -- -- 72 16 97 %  08/30/22 2047 -- -- -- -- -- 97 %  08/30/22 2030 112/71 -- -- 71 16 97 %  08/30/22 2000 119/71 98.7 F (37.1 C) Axillary 71 16 98 %  08/30/22 1900 118/69 -- -- 68 16 99 %  08/30/22 1600 123/76 -- -- 64 16 98 %  08/30/22 1548 -- -- -- -- -- 99 %  08/30/22 1500 113/78 -- -- 64 16 99 %  08/30/22 1400 104/70 -- -- 63 16 96 %  08/30/22 1300 104/71 -- --  62 16 97 %     PHYSICAL EXAM:  General: nasally intubated sedated.  Head: Normocephalic, without obvious abnormality, atraumatic. Eyes: Conjunctivae clear, anicteric sclerae. Pupils are equal ENT cannot assess Neck: swelling resolved Lungs: b/l air entry Heart: bradycardia. Abdomen: Soft, non-tender,not distended. Bowel sounds normal. No masses Extremities: spider veins over legs Skin: scabbed excoriation/superficial scratch No wounds No injuries Neurologic: cannot assess  Lab Results    Latest Ref Rng & Units 08/31/2022    3:38 AM 08/30/2022    7:00 AM 08/29/2022    2:59 AM  CBC  WBC 4.0 - 10.5 K/uL 3.2  2.6  3.8   Hemoglobin 12.0 - 15.0 g/dL 38.7  56.4  33.2   Hematocrit 36.0 - 46.0 % 30.7  33.1  40.5   Platelets 150 - 400 K/uL 71  68  68        Latest Ref Rng & Units 08/31/2022    3:38 AM 08/30/2022    7:00 AM 08/29/2022    5:44 AM  CMP  Glucose 70 - 99 mg/dL 951  884  91   BUN 8 - 23 mg/dL 22  24  31    Creatinine 0.44 - 1.00 mg/dL 1.66  0.63  0.16   Sodium 135 - 145 mmol/L 143  143  141   Potassium 3.5 - 5.1 mmol/L 3.9  3.6  3.6   Chloride 98 - 111 mmol/L 109  109  109   CO2 22 - 32 mmol/L 28  27  26    Calcium 8.9 - 10.3 mg/dL 7.5  7.9  7.9       Microbiology: BC x4 pasteurella  Studies/Results: DG Chest Port 1 View  Result Date: 08/31/2022 CLINICAL DATA:  Acute respiratory  failure. EXAM: PORTABLE CHEST 1 VIEW COMPARISON:  08/26/2022. FINDINGS: Endotracheal tube tip now projects 1 cm above the carina. Enteric tube courses below the left hemidiaphragm, beyond the field of view. No consolidation or pulmonary edema. Stable cardiac and mediastinal contours. No pleural effusion or pneumothorax. IMPRESSION: Endotracheal tube tip now projects 1 cm above the carina. Recommend retraction by 1 cm. Electronically Signed   By: Orvan Falconer M.D.   On: 08/31/2022 10:35     Assessment/Plan: Pasteurella bacteremia-  TEE is needed to r/o endocarditis- scheduled for tomorrow On unasyn   Ludwigs angina  steroids DC  On antibiotic  HEPC- active 13 million copies Has baseline intermittent leucopenia and thrombocytopenia since 2009 - cirrhosis  H/o substance use Urine tox screen positive for amphetamines   Discussed the management with intensivist

## 2022-08-31 NOTE — Progress Notes (Signed)
Tape changed

## 2022-09-01 ENCOUNTER — Encounter
Admission: EM | Disposition: A | Discharge status: Home Health | Payer: Self-pay | Source: Home / Self Care | Attending: Internal Medicine

## 2022-09-01 ENCOUNTER — Inpatient Hospital Stay (HOSPITAL_COMMUNITY)
Admit: 2022-09-01 | Discharge: 2022-09-01 | Disposition: A | Discharge status: Home / Self Care | Payer: Medicare Other | Attending: Nurse Practitioner | Admitting: Nurse Practitioner

## 2022-09-01 ENCOUNTER — Inpatient Hospital Stay: Payer: Medicare Other

## 2022-09-01 DIAGNOSIS — R7881 Bacteremia: Secondary | ICD-10-CM

## 2022-09-01 DIAGNOSIS — I340 Nonrheumatic mitral (valve) insufficiency: Secondary | ICD-10-CM

## 2022-09-01 LAB — PHOSPHORUS: Phosphorus: 3.5 mg/dL (ref 2.5–4.6)

## 2022-09-01 LAB — GLUCOSE, CAPILLARY
Glucose-Capillary: 108 mg/dL — ABNORMAL HIGH (ref 70–99)
Glucose-Capillary: 123 mg/dL — ABNORMAL HIGH (ref 70–99)
Glucose-Capillary: 147 mg/dL — ABNORMAL HIGH (ref 70–99)
Glucose-Capillary: 68 mg/dL — ABNORMAL LOW (ref 70–99)
Glucose-Capillary: 71 mg/dL (ref 70–99)
Glucose-Capillary: 84 mg/dL (ref 70–99)

## 2022-09-01 LAB — BLOOD GAS, VENOUS
Acid-Base Excess: 5.5 mmol/L — ABNORMAL HIGH (ref 0.0–2.0)
Bicarbonate: 28.9 mmol/L — ABNORMAL HIGH (ref 20.0–28.0)
O2 Saturation: 99.8 %
Patient temperature: 37
pCO2, Ven: 37 mmHg — ABNORMAL LOW (ref 44–60)
pH, Ven: 7.5 — ABNORMAL HIGH (ref 7.25–7.43)
pO2, Ven: 135 mmHg — ABNORMAL HIGH (ref 32–45)

## 2022-09-01 LAB — MAGNESIUM: Magnesium: 2 mg/dL (ref 1.7–2.4)

## 2022-09-01 LAB — BASIC METABOLIC PANEL
Anion gap: 10 (ref 5–15)
BUN: 18 mg/dL (ref 8–23)
CO2: 21 mmol/L — ABNORMAL LOW (ref 22–32)
Calcium: 7.7 mg/dL — ABNORMAL LOW (ref 8.9–10.3)
Chloride: 109 mmol/L (ref 98–111)
Creatinine, Ser: 0.56 mg/dL (ref 0.44–1.00)
GFR, Estimated: >60 mL/min (ref >60)
Glucose, Bld: 88 mg/dL (ref 70–99)
Potassium: 3.9 mmol/L (ref 3.5–5.1)
Sodium: 140 mmol/L (ref 135–145)

## 2022-09-01 LAB — CBC
HCT: 35.5 % — ABNORMAL LOW (ref 36.0–46.0)
Hemoglobin: 11.8 g/dL — ABNORMAL LOW (ref 12.0–15.0)
MCH: 32.4 pg (ref 26.0–34.0)
MCHC: 33.2 g/dL (ref 30.0–36.0)
MCV: 97.5 fL (ref 80.0–100.0)
Platelets: 84 10*3/uL — ABNORMAL LOW (ref 150–400)
RBC: 3.64 MIL/uL — ABNORMAL LOW (ref 3.87–5.11)
RDW: 14 % (ref 11.5–15.5)
WBC: 4.2 10*3/uL (ref 4.0–10.5)
nRBC: 0 % (ref 0.0–0.2)

## 2022-09-01 LAB — CULTURE, RESPIRATORY W GRAM STAIN

## 2022-09-01 SURGERY — ECHOCARDIOGRAM, TRANSESOPHAGEAL
Anesthesia: Moderate Sedation

## 2022-09-01 MED ORDER — MORPHINE SULFATE (PF) 2 MG/ML IV SOLN
2.0000 mg | INTRAVENOUS | Status: AC
  Administered 2022-09-01: 2 mg via INTRAVENOUS
  Filled 2022-09-01: qty 1

## 2022-09-01 MED ORDER — METOPROLOL TARTRATE 5 MG/5ML IV SOLN
2.5000 mg | Freq: Four times a day (QID) | INTRAVENOUS | Status: DC | PRN
  Administered 2022-09-01: 5 mg via INTRAVENOUS
  Filled 2022-09-01: qty 5

## 2022-09-01 MED ORDER — IPRATROPIUM-ALBUTEROL 0.5-2.5 (3) MG/3ML IN SOLN
3.0000 mL | RESPIRATORY_TRACT | Status: DC
  Administered 2022-09-01 – 2022-09-02 (×5): 3 mL via RESPIRATORY_TRACT
  Filled 2022-09-01 (×5): qty 3

## 2022-09-01 MED ORDER — FUROSEMIDE 10 MG/ML IJ SOLN
60.0000 mg | Freq: Once | INTRAMUSCULAR | Status: AC
  Administered 2022-09-01: 60 mg via INTRAVENOUS
  Filled 2022-09-01: qty 6

## 2022-09-01 MED ORDER — IPRATROPIUM-ALBUTEROL 0.5-2.5 (3) MG/3ML IN SOLN
3.0000 mL | Freq: Once | RESPIRATORY_TRACT | Status: AC
  Administered 2022-09-01: 3 mL via RESPIRATORY_TRACT

## 2022-09-01 MED ORDER — LIDOCAINE VISCOUS HCL 2 % MT SOLN
OROMUCOSAL | Status: AC
  Filled 2022-09-01: qty 15

## 2022-09-01 MED ORDER — BUDESONIDE 0.5 MG/2ML IN SUSP
0.5000 mg | Freq: Two times a day (BID) | RESPIRATORY_TRACT | Status: DC
  Administered 2022-09-01 – 2022-09-03 (×3): 0.5 mg via RESPIRATORY_TRACT
  Filled 2022-09-01 (×3): qty 2

## 2022-09-01 MED ORDER — MIDAZOLAM HCL 2 MG/2ML IJ SOLN
INTRAMUSCULAR | Status: AC
  Filled 2022-09-01: qty 2

## 2022-09-01 MED ORDER — SODIUM CHLORIDE 0.9 % IV SOLN
250.0000 mL | INTRAVENOUS | Status: DC
  Administered 2022-09-01: 250 mL via INTRAVENOUS

## 2022-09-01 MED ORDER — NOREPINEPHRINE 4 MG/250ML-% IV SOLN
2.0000 ug/min | INTRAVENOUS | Status: DC
  Filled 2022-09-01: qty 250

## 2022-09-01 MED ORDER — MIDAZOLAM HCL 2 MG/2ML IJ SOLN
2.0000 mg | Freq: Once | INTRAMUSCULAR | Status: AC
  Administered 2022-09-01: 2 mg via INTRAVENOUS

## 2022-09-01 MED ORDER — FUROSEMIDE 10 MG/ML IJ SOLN
40.0000 mg | Freq: Once | INTRAMUSCULAR | Status: AC
  Administered 2022-09-02: 40 mg via INTRAVENOUS
  Filled 2022-09-01: qty 4

## 2022-09-01 MED ORDER — DEXTROSE 50 % IV SOLN
12.5000 g | INTRAVENOUS | Status: AC
  Administered 2022-09-01: 12.5 g via INTRAVENOUS
  Filled 2022-09-01: qty 50

## 2022-09-01 MED ORDER — ORAL CARE MOUTH RINSE: 15.0000 mL | OROMUCOSAL | Status: DC | PRN

## 2022-09-01 MED ORDER — DEXMEDETOMIDINE HCL IN NACL 400 MCG/100ML IV SOLN
INTRAVENOUS | Status: AC
  Filled 2022-09-01: qty 100

## 2022-09-01 MED ORDER — DEXMEDETOMIDINE HCL IN NACL 400 MCG/100ML IV SOLN
0.0000 ug/kg/h | INTRAVENOUS | Status: DC
  Administered 2022-09-01: 0.8 ug/kg/h via INTRAVENOUS
  Administered 2022-09-01: 0.9 ug/kg/h via INTRAVENOUS
  Administered 2022-09-01: 0.6 ug/kg/h via INTRAVENOUS
  Administered 2022-09-02: 1 ug/kg/h via INTRAVENOUS
  Administered 2022-09-02: 0.4 ug/kg/h via INTRAVENOUS
  Administered 2022-09-02: 1 ug/kg/h via INTRAVENOUS
  Filled 2022-09-01 (×5): qty 100

## 2022-09-01 MED ORDER — METHYLPREDNISOLONE SODIUM SUCC 40 MG IJ SOLR
40.0000 mg | Freq: Two times a day (BID) | INTRAMUSCULAR | Status: DC
  Administered 2022-09-01 – 2022-09-03 (×5): 40 mg via INTRAVENOUS
  Filled 2022-09-01 (×5): qty 1

## 2022-09-01 NOTE — Progress Notes (Signed)
Date of Admission:  08/24/2022    ID: Sierra Patterson is a 62 y.o. female Principal Problem:   Laryngeal edema Active Problems:   Acute hypoxic respiratory failure (HCC)   Ludwig's angina   Sepsis (HCC)   Gram-negative bacteremia    Subjective: Pt had TEE Extubated Sleeping because of sedation   Medications:   budesonide (PULMICORT) nebulizer solution  0.5 mg Nebulization BID   Chlorhexidine Gluconate Cloth  6 each Topical Nightly   clonazepam  0.5 mg Per Tube BID   enoxaparin (LOVENOX) injection  40 mg Subcutaneous Q24H   insulin aspart  0-15 Units Subcutaneous Q4H   ipratropium-albuterol  3 mL Nebulization Q4H   methylPREDNISolone (SOLU-MEDROL) injection  40 mg Intravenous Q12H    Objective: Vital signs in last 24 hours: Patient Vitals for the past 24 hrs:  BP Temp Temp src Pulse Resp SpO2  09/01/22 1645 120/69 -- -- 67 (!) 25 99 %  09/01/22 1630 132/76 -- -- 65 (!) 24 100 %  09/01/22 1625 -- -- -- -- -- 100 %  09/01/22 1615 130/80 -- -- 64 (!) 25 99 %  09/01/22 1600 106/66 -- -- 61 (!) 28 99 %  09/01/22 1545 105/67 -- -- 60 (!) 28 98 %  09/01/22 1535 -- 99 F (37.2 C) Axillary 62 (!) 25 99 %  09/01/22 1530 122/66 -- -- 63 (!) 31 99 %  09/01/22 1515 (!) 90/53 -- -- 60 (!) 31 98 %  09/01/22 1500 (!) 83/53 -- -- (!) 58 (!) 31 96 %  09/01/22 1445 (!) 75/43 -- -- 60 (!) 33 97 %  09/01/22 1430 (!) 74/48 -- -- 61 (!) 32 96 %  09/01/22 1415 123/73 -- -- 65 (!) 26 93 %  09/01/22 1400 121/88 -- -- 79 (!) 41 96 %  09/01/22 1345 (!) 151/82 -- -- 83 (!) 29 94 %  09/01/22 1335 -- -- -- 73 (!) 40 98 %  09/01/22 1330 (!) 164/97 -- -- (!) 102 (!) 28 97 %  09/01/22 1315 125/77 -- -- 77 (!) 39 95 %  09/01/22 1300 (!) 158/79 -- -- 97 (!) 52 95 %  09/01/22 1245 (!) 165/90 -- -- 96 (!) 41 (!) 88 %  09/01/22 1241 -- (!) 100.5 F (38.1 C) Axillary 100 (!) 29 (!) 87 %  09/01/22 1230 (!) 196/122 -- -- (!) 105 (!) 40 (!) 89 %  09/01/22 1229 -- -- -- (!) 111 (!) 38 90 %  09/01/22 1215  (!) 177/158 -- -- 99 (!) 50 (!) 88 %  09/01/22 1201 (!) 179/103 -- -- (!) 103 (!) 32 (!) 85 %  09/01/22 1200 (!) 192/106 -- -- (!) 102 17 (!) 86 %  09/01/22 1145 (!) 178/102 -- -- (!) 104 (!) 29 93 %  09/01/22 1130 (!) 177/93 -- -- (!) 108 (!) 27 90 %  09/01/22 1115 (!) 176/112 -- -- (!) 101 (!) 35 96 %  09/01/22 1107 -- -- -- (!) 114 (!) 27 91 %  09/01/22 1100 (!) 181/96 -- -- (!) 104 (!) 33 91 %  09/01/22 1045 (!) 180/86 -- -- (!) 112 (!) 43 96 %  09/01/22 1037 -- -- -- (!) 103 (!) 34 96 %  09/01/22 1030 (!) 166/96 -- -- (!) 103 (!) 29 97 %  09/01/22 1020 -- (!) 100.4 F (38 C) Axillary (!) 104 (!) 33 98 %  09/01/22 1015 (!) 180/93 -- -- (!) 113 (!) 28 98 %  09/01/22 1005 (!) 192/104 -- -- (!) 127 (!) 38 99 %  09/01/22 1001 (!) 201/120 -- -- (!) 130 (!) 27 99 %  09/01/22 1000 (!) 191/128 -- -- (!) 123 (!) 33 99 %  09/01/22 0958 -- -- -- (!) 133 (!) 27 100 %  09/01/22 0935 -- -- -- -- -- 100 %  09/01/22 0930 (!) 173/111 -- -- (!) 109 20 100 %  09/01/22 0900 (!) 134/93 -- -- 75 16 100 %  09/01/22 0830 108/78 -- -- 64 16 98 %  09/01/22 0800 128/89 -- -- 70 16 98 %  09/01/22 0711 -- -- -- 80 -- 99 %  09/01/22 0700 (!) 148/96 -- -- 79 -- 99 %  09/01/22 0600 (!) 134/98 -- -- 70 -- 100 %  09/01/22 0537 -- -- -- 68 -- 100 %  09/01/22 0500 111/79 -- -- (!) 59 -- 100 %  09/01/22 0440 -- -- -- (!) 59 -- 99 %  09/01/22 0400 104/78 -- -- 64 -- 97 %  09/01/22 0300 114/82 -- -- 73 -- 96 %  09/01/22 0234 -- -- -- 87 -- 97 %  09/01/22 0200 (!) 136/91 -- -- 82 -- 100 %  09/01/22 0100 117/74 -- -- 65 18 100 %  09/01/22 0043 -- -- -- 66 16 98 %  09/01/22 0000 93/62 -- -- 67 16 99 %  08/31/22 2300 118/79 -- -- 66 16 98 %  08/31/22 2200 120/80 -- -- 65 16 100 %  08/31/22 2100 111/72 -- -- 62 16 100 %  08/31/22 2056 -- -- -- 62 16 100 %  08/31/22 2000 106/68 99.3 F (37.4 C) -- 64 16 99 %  08/31/22 1800 124/75 -- -- 65 16 99 %  08/31/22 1700 108/73 -- -- 61 16 97 %     PHYSICAL EXAM:   General:extubated Sleeping due to sedation ENT cannot assess Neck: swelling resolved Lungs: b/l air entry Heart: s1s2 Abdomen: Soft, non-tender,not distended. Bowel sounds normal. No masses Extremities: spider veins over legs Skin: scabbed excoriation/superficial scratch No wounds No injuries Neurologic: cannot assess  Lab Results    Latest Ref Rng & Units 09/01/2022    5:41 AM 08/31/2022    3:38 AM 08/30/2022    7:00 AM  CBC  WBC 4.0 - 10.5 K/uL 4.2  3.2  2.6   Hemoglobin 12.0 - 15.0 g/dL 16.1  09.6  04.5   Hematocrit 36.0 - 46.0 % 35.5  30.7  33.1   Platelets 150 - 400 K/uL 84  71  68        Latest Ref Rng & Units 09/01/2022    5:41 AM 08/31/2022    3:38 AM 08/30/2022    7:00 AM  CMP  Glucose 70 - 99 mg/dL 88  409  811   BUN 8 - 23 mg/dL 18  22  24    Creatinine 0.44 - 1.00 mg/dL 9.14  7.82  9.56   Sodium 135 - 145 mmol/L 140  143  143   Potassium 3.5 - 5.1 mmol/L 3.9  3.9  3.6   Chloride 98 - 111 mmol/L 109  109  109   CO2 22 - 32 mmol/L 21  28  27    Calcium 8.9 - 10.3 mg/dL 7.7  7.5  7.9       Microbiology: BC x4 pasteurella  Studies/Results: DG Chest Port 1 View  Result Date: 09/01/2022 CLINICAL DATA:  Hypoxia EXAM: PORTABLE CHEST 1 VIEW COMPARISON:  08/31/2022 FINDINGS:  Endotracheal tube and gastric catheter are again seen and stable. Cardiac shadow is stable. Aortic calcifications are seen. Lungs are clear bilaterally. No focal infiltrate or effusion is seen. IMPRESSION: No acute abnormality noted.  Tubes and lines as described. Electronically Signed   By: Alcide Clever M.D.   On: 09/01/2022 02:43   DG Chest Port 1 View  Result Date: 08/31/2022 CLINICAL DATA:  Acute respiratory failure. EXAM: PORTABLE CHEST 1 VIEW COMPARISON:  08/26/2022. FINDINGS: Endotracheal tube tip now projects 1 cm above the carina. Enteric tube courses below the left hemidiaphragm, beyond the field of view. No consolidation or pulmonary edema. Stable cardiac and mediastinal contours. No pleural  effusion or pneumothorax. IMPRESSION: Endotracheal tube tip now projects 1 cm above the carina. Recommend retraction by 1 cm. Electronically Signed   By: Orvan Falconer M.D.   On: 08/31/2022 10:35     Assessment/Plan: Pasteurella bacteremia-  TEE no  endocarditis- On unasyn for a total of 14 days   Ludwigs angina   On antibiotic  HEPC- active 13 million copies Will need treatment as OP  Has baseline intermittent leucopenia and thrombocytopenia since 2009 - cirrhosis  H/o substance use Urine tox screen positive for amphetamines   Discussed the management with her nurse

## 2022-09-01 NOTE — Progress Notes (Signed)
Hypoglycemic Event  CBG: 68  Treatment: D50 25 mL (12.5 gm) 0819  Symptoms: None  Follow-up CBG: [id number] CBG Result:84  Possible Reasons for Event: Inadequate meal intake  Comments/MD notified:Kasa, Lucienne Capers

## 2022-09-01 NOTE — Progress Notes (Signed)
NAME:  SAVANNAHROSE ARRONA, MRN:  [id number], DOB:  10-Sep-1960, LOS: 8 ADMISSION DATE:  08/24/2022, CONSULTATION DATE:  08/24/2022 REFERRING MD:  Dr. Rosalia Hammers, CHIEF COMPLAINT:  Facial swelling   Brief Pt Description / Synopsis:  62 y.o. 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  History of Present Illness:  Artia Marsala is a 62 year old female with a past medical history significant for COPD, hypertension, cirrhosis due to hepatitis C who presents to [facility] ED on 08/24/2022 due to complaints of right-sided facial swelling.  Patient is currently intubated and sedated and no family is currently available, therefore history is obtained from chart review.  Per ED and nursing notes the patient was working outside in her yard yesterday evening.  She did not notice any bug bites or bee stings, however yesterday been began to notice some swelling over the right side of her face with some neck soreness.  This morning the swelling worsened prompting her to seek evaluation in the ED.  She reported that is now becoming difficult to swallow and feels as if her throat is swelling and starting to close then.  She denied any ACE inhibitor use, fever, chills, rash, chest pain, shortness of breath, vomiting, nausea, vomiting, diarrhea, abdominal pain, dysuria.  ED Course: Initial Vital Signs: Temperature 100.1 F orally, respiratory rate 24, pulse 107, blood pressure 192/98, SpO2 95% on room air Significant Labs: Sodium 130, potassium 3.4, glucose 127, total bilirubin 2.3, lactic acid 1.4, procalcitonin 0.53, WBC 12.1 with neutrophilia COVID-19 PCR is negative Imaging CT soft tissue of the Neck>>IMPRESSION: Supraglottic laryngeal edema worse towards the right with airway narrowing. There is also notable right submandibular space and gland inflammation, the primary infectious/inflammatory  cause is uncertain. No collection. Medications Administered: IV Unasyn, 10 mg Decadron, 1 L LR  On exam she was noted to have swelling along the right lower face into the submandibular area with tenderness to palpation, along with some uvular swelling and asymmetric edema on the right side of the posterior oropharynx.  She did not exhibit any stridor or wheezing, but her voice was hoarse and somewhat muffled, she was able to manage secretions without issue.  She was evaluated by ENT who recommended intubating in the OR for airway protection.  PCCM is asked to admit for further workup and treatment postintubation.  Please see "significant hospital events" section below for full detailed hospital course.   Pertinent  Medical History   Past Medical History:  Diagnosis Date   Cirrhosis (HCC)     Micro Data:  5/27: Blood culture x2>> 4/4 bottles with gram - rods PASTURELLA SPECIES 5/27: COVID-19/RSV/FLU PCR>>negative 5/27: RVP>> negative 5/27: Group A Strep PCR>>negative 5/27: Strep pneumo urinary antigen>>negative   Antimicrobials:   Anti-infectives (From admission, onward)    Start     Dose/Rate Route Frequency Ordered Stop   08/27/22 1330  Ampicillin-Sulbactam (UNASYN) 3 g in sodium chloride 0.9 % 100 mL IVPB        3 g 200 mL/hr over 30 Minutes Intravenous Every 6 hours 08/27/22 1238     08/25/22 1000  piperacillin-tazobactam (ZOSYN) IVPB 3.375 g  Status:  Discontinued        3.375 g 12.5 mL/hr over 240 Minutes Intravenous Every 8 hours 08/25/22 0808 08/27/22 1238   08/24/22 1800  Ampicillin-Sulbactam (UNASYN) 3 g in sodium chloride 0.9 % 100 mL IVPB  Status:  Discontinued  3 g 200 mL/hr over 30 Minutes Intravenous Every 6 hours 08/24/22 1033 08/25/22 0803   08/24/22 0915  Ampicillin-Sulbactam (UNASYN) 3 g in sodium chloride 0.9 % 100 mL IVPB        3 g 200 mL/hr over 30 Minutes Intravenous  Once 08/24/22 0914 08/24/22 1017        Significant Hospital  Events: Including procedures, antibiotic start and stop dates in addition to other pertinent events   5/27: Presents to ED with right sided facial and throat swelling.  ENT intubated in OR for airway protection.   PCCM asked to admit+PASTURELLA SPECIES 5/28: Blood cultures overnight with Gram - rods.  Will change ABX to Zosyn and consult ID. Obtain Echocardiogram.  Requiring low dose pressors (Levophed 2 mcg) 5/29: No acute events overnight.  Pt remains on minimal vent settings: PEEP 5/FiO2 28%. OGT changed to NGT per ENT recommendations will await ENT's recommendations regarding extubation following ENT airway examination  5/30: Increased peak pressures overnight, resolved with pt repositioning.  Per ENT recommendations CT Soft Tissue Neck with Contrast pending this afternoon to assess airway for possible extubation in the next 24 to 48hrs.  Pt remains on minimal vent settings   08/28/22- patient is with no acute events overnight. Reviewed medical plan with ENT no additional surgical intervention planned. ID with some concern post microbiology pasturella results, patient may need TEE. Cardiology will be consulted for this procedure.  08/30/22- patient s/p cardiology evaluation , there is plan for TEE in am will keep patient on MV until this procedure is finished then can likely work on extubation.  6/3 remains nasally intubated plan for TEE 6/4 remains intubated for TEE, if TEE does not occur today will plan for extubation   Interim History / Subjective:  Remains nasally  intubated Remains on vent +sepsis with bacteremia TEE pending  Vent Mode: PRVC FiO2 (%):  [28 %] 28 % Set Rate:  [16 bmp] 16 bmp Vt Set:  [45 mL-450 mL] 45 mL PEEP:  [5 cmH20] 5 cmH20 Plateau Pressure:  [20 cmH20-22 cmH20] 20 cmH20   Objective   Blood pressure (!) 148/96, pulse 79, temperature 99.3 F (37.4 C), resp. rate 18, height 5\' 2"  (1.575 m), weight 91.9 kg, SpO2 99 %. Intake/Output Summary (Last 24 hours) at  09/01/2022 0713 Last data filed at 09/01/2022 2956 Gross per 24 hour  Intake 1689.24 ml  Output 1055 ml  Net 634.24 ml    Filed Weights   08/27/22 0500 08/28/22 0500 08/30/22 0415  Weight: 90.3 kg 91.8 kg 91.9 kg     REVIEW OF SYSTEMS  PATIENT IS UNABLE TO PROVIDE COMPLETE REVIEW OF SYSTEMS DUE TO SEVERE CRITICAL ILLNESS   PHYSICAL EXAMINATION:  GENERAL:critically ill appearing,  EYES: Pupils equal, round, reactive to light.  No scleral icterus.  MOUTH: Moist mucosal membrane. Nasally  INTUBATED NECK: Supple.  PULMONARY: Lungs clear to auscultation, +rhonchi, CARDIOVASCULAR: S1 and S2.  Regular rate and rhythm GASTROINTESTINAL: Soft, nontender, -distended. Positive bowel sounds.  MUSCULOSKELETAL: No swelling, clubbing, or edema.  NEUROLOGIC: obtunded,sedated SKIN:normal, warm to touch, Capillary refill delayed  Pulses present bilaterally       Assessment & Plan:  Admitted to ICU diagnosis of acute upper airway obstruction leading to nasal Intubation for airway protection in setting of right sided laryngeal and submandibular space edema Ludwig's angina with COPD without acute exacerbation  Severe ACUTE Hypoxic and Hypercapnic Respiratory Failure -continue Mechanical Ventilator support -Wean Fio2 and PEEP as tolerated -VAP/VENT bundle implementation -  Wean PEEP & FiO2 as tolerated, maintain SpO2 > 88% - Head of bed elevated 30 degrees, VAP protocol in place - Plateau pressures less than 30 cm H20  - Intermittent chest x-ray & ABG PRN - Ensure adequate pulmonary hygiene  -will perform SAT/SBT when respiratory parameters are met once TEE has been completed and ENT has evaluated the patient   Right sided Laryngeal and Submandibular space Edema, 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 No reported bee sting or bug bites and no associated hives or hypotension to suggest anaphylaxis rxn, does not take ACE-i CT Neck on presentation with supraglottic laryngeal edema (worse on right) with  airway narrowing and notable right submandibular space and gland inflammation (but NO abscess collection and uncertain primary infectious/inflammatory etiology) IV Pepcid  IV steroids   SEVERE SEPSIS 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  Submandibular/Peritonsillar Infection & Ludwig's Angina - Monitor fever curve - Trend WBC's & Procalcitonin - Follow cultures as above - ID consulted appreciate input: continue zosyn for now  - Cardiology consulted for TEE to r/o endocarditis, however will need ENT clearance prior to TEE    SEPTIC shock SOURCE-bacteremia -use vasopressors to keep MAP>65 as needed  ABX as prescribed   Chronic Thrombocytopenia due to Cirrhosis - Trend CBC  - Monitor for s/sx of bleeding  - Lovenox for VTE Prophylaxis  - Transfuse for Hgb <7 - Transfuse platelets for platelet count <50K with active bleeding   NEUROLOGY ACUTE METABOLIC ENCEPHALOPATHY -need for sedation -Goal RASS -2 to -3   ENDO - ICU hypoglycemic\Hyperglycemia protocol -check FSBS per protocol - ICU hypoglycemic\Hyperglycemia protocol -check FSBS per protocol - CBG's q4h; Target range of 140 to 180  GI GI PROPHYLAXIS as indicated  NUTRITIONAL STATUS DIET-->TF's as tolerated Constipation protocol as indicated   ELECTROLYTES -follow labs as needed -replace as needed -pharmacy consultation and following   Best Practice (right click and "Reselect all SmartList Selections" daily)   Diet/type: NPO, Continue TF's DVT prophylaxis: LMWH GI prophylaxis: H2B Lines: N/A Foley: N/A Code Status:  full code Last date of multidisciplinary goals of care discussion [08/27/22]   Labs   CBC: Recent Labs  Lab 08/26/22 1028 08/27/22 0450 08/28/22 0658 08/29/22 0259 08/30/22 0700 08/31/22 0338 09/01/22 0541  WBC 4.5   < > 2.4* 3.8* 2.6* 3.2* 4.2  NEUTROABS 3.7  --   --   --   --   --   --   HGB 11.7*   < > 11.5* 13.6 10.9* 10.2* 11.8*  HCT 32.3*   < > 33.6* 40.5  33.1* 30.7* 35.5*  MCV 91.5   < > 95.2 96.9 98.2 97.8 97.5  PLT 85*   < > 77* 68* 68* 71* 84*   < > = values in this interval not displayed.     Basic Metabolic Panel: Recent Labs  Lab 08/28/22 0658 08/29/22 0259 08/29/22 0544 08/30/22 0700 08/31/22 0338 09/01/22 0541  NA 139  --  141 143 143 140  K 3.9  --  3.6 3.6 3.9 3.9  CL 107  --  109 109 109 109  CO2 26  --  26 27 28  21*  GLUCOSE 113*  --  91 119* 118* 88  BUN 33*  --  31* 24* 22 18  CREATININE 0.70  --  0.63 0.59 0.58 0.56  CALCIUM 7.9*  --  7.9* 7.9* 7.5* 7.7*  MG 2.3 2.4  --  2.1 2.1 2.0  PHOS 3.2  --  3.6 3.9 3.7 3.5  GFR: Estimated Creatinine Clearance: 77.9 mL/min (by C-G formula based on SCr of 0.56 mg/dL). Recent Labs  Lab 08/26/22 0724 08/26/22 1028 08/27/22 0450 08/28/22 0658 08/29/22 0259 08/30/22 0700 08/31/22 0338 09/01/22 0541  PROCALCITON 4.67  --  2.23  --   --   --   --   --   WBC QUESTIONABLE RESULTS, RECOMMEND RECOLLECT TO VERIFY   < > 3.3*   < > 3.8* 2.6* 3.2* 4.2   < > = values in this interval not displayed.    HbA1C: Hgb A1c MFr Bld  Date/Time Value Ref Range Status  08/25/2022 05:46 AM 5.0 4.8 - 5.6 % Final    Comment:    (NOTE)         Prediabetes: 5.7 - 6.4         Diabetes: >6.4         Glycemic control for adults with diabetes: <7.0        DVT/GI PRX  assessed I Assessed the need for Labs I Assessed the need for Foley I Assessed the need for Central Venous Line Family Discussion when available I Assessed the need for Mobilization I made an Assessment of medications to be adjusted accordingly Safety Risk assessment completed  CASE DISCUSSED IN MULTIDISCIPLINARY ROUNDS WITH ICU TEAM     Critical Care Time devoted to patient care services described in this note is 55 minutes.  Critical care was necessary to treat /prevent imminent and life-threatening deterioration.   Lucie Leather, M.D.  [facility]  Medical Director  [facility] Medical Director [facility] Cardio-P[facility] Department

## 2022-09-01 NOTE — Consult Note (Signed)
PHARMACY CONSULT NOTE  Pharmacy Consult for Electrolyte Monitoring and Replacement   Recent Labs: Potassium (mmol/L)  Date Value  09/01/2022 3.9  01/10/2014 3.7   Magnesium (mg/dL)  Date Value  16/12/9602 2.0   Calcium (mg/dL)  Date Value  54/11/8117 7.7 (L)   Calcium, Total (mg/dL)  Date Value  14/78/2956 8.3 (L)   Albumin (g/dL)  Date Value  21/30/8657 2.1 (L)  01/10/2014 3.4   Phosphorus (mg/dL)  Date Value  84/69/6295 3.5   Sodium (mmol/L)  Date Value  09/01/2022 140  01/10/2014 142   Corrected Calcium: 8.82 mg/dL  Assessment: Patient presented with complaint of right sided lower jaw swelling. Side of face and neck are swollen. Pharmacy has been consulted to monitor and replace electrolytes under PCCM care.  Goal of Therapy:  Electrolytes WNL  Plan:  No current replacement indicated F/u with AM labs.   Elliot Gurney, PharmD, BCPS Clinical Pharmacist  09/01/2022 7:42 AM

## 2022-09-01 NOTE — Progress Notes (Signed)
*  PRELIMINARY RESULTS* Echocardiogram Echocardiogram Transesophageal has been performed.  Cristela Blue 09/01/2022, 8:36 AM

## 2022-09-01 NOTE — Progress Notes (Signed)
Transesophageal Echocardiogram :  Indication: Bacteremia, Pasteurella Requesting/ordering  physician:   Procedure: 2 mls x 1 of viscous lidocaine were given orally to provide local anesthesia to the oropharynx.  Patient was intubated and sedated, the patient was positioned supine on the left side at 45 degree angle, bite block provided.   Using digital technique an omniplane probe was advanced into the distal esophagus without incident.   Moderate sedation: 1. Sedation used:  , Fentanyl: 100 ug iv bolus 2. Time administered: 7:45 AM   time when patient started recovery: 8:15 AM Total sedation time 30 minutes 3. I was face to face during this time  See report in EPIC  for complete details: In brief, (please refer to complete TEE report for details) No valve vegetation noted concerning for endocarditis transgastric imaging revealed normal LV function with no RWMAs and no mural apical thrombus.  .  Estimated ejection fraction was 55%.  Right sided cardiac chambers were normal with no evidence of pulmonary hypertension.  Moderate mitral valve regurgitation, eccentric  Imaging of the septum showed no ASD or VSD Bubble study was negative for shunt 2D and color flow confirmed no PFO  The LA was well visualized in orthogonal views.  There was no spontaneous contrast and no thrombus in the LA and LA appendage   The descending thoracic aorta had no  mural aortic debris with no evidence of aneurysmal dilation or disection   Julien Nordmann 09/01/2022 8:31 AM

## 2022-09-01 NOTE — Progress Notes (Signed)
Nutrition Follow Up Note   DOCUMENTATION CODES:   Obesity unspecified  INTERVENTION:   Ensure Enlive po TID with diet advancement, each supplement provides 350 kcal and 20 grams of protein.  MVI po daily with diet advancement   NUTRITION DIAGNOSIS:   Inadequate oral intake related to inability to eat (pt sedated and ventilated) as evidenced by NPO status.  GOAL:   Patient will meet greater than or equal to 90% of their needs -previously met with tube feeds   MONITOR:   Diet advancement, Labs, Weight trends, I & O's, Skin  ASSESSMENT:   62 y.o. female with h/o chronic pain, depression, COPD, hepatitis C and cirrhosis who presents with right sided laryngeal and right submandibular edema concerning for possible Ludwig's Angina, requiring nasotracheal intubation by ENT for airway protection  Pt extubated this morning. Plan is for SLP evaluation once appropriate. RD will add supplements and MVI with diet advancement. Per chart, pt is up ~27lbs since admission; pt has not been weighed since 6/2.   Medications reviewed and include: colace, lovenox, insulin, miralax, unasyn, pepcid  Labs reviewed: K 3.9 wnl, P 3.5 wnl, Mg 2.0 wnl Cbgs- 108, 84, 68, 71 x 24 hrs   Diet Order:    Diet Order             Diet NPO time specified  Diet effective midnight                  EDUCATION NEEDS:   No education needs have been identified at this time  Skin:  Skin Assessment: Reviewed RN Assessment (incision nose, ecchymosis)  Last BM:  6/4- TYPE 6  Height:   Ht Readings from Last 1 Encounters:  08/24/22 5\' 2"  (1.575 m)    Weight:   Wt Readings from Last 1 Encounters:  08/30/22 91.9 kg    Ideal Body Weight:  50 kg  BMI:  Body mass index is 37.06 kg/m.  Estimated Nutritional Needs:   Kcal:  1700-2000kcal/day  Protein:  85-100g/day  Fluid:  1.5-1.7L/day  Betsey Holiday MS, RD, LDN Please refer to [facility] for RD and/or RD on-call/weekend/after hours pager

## 2022-09-01 NOTE — Progress Notes (Signed)
Patient extubated to 2L [place] SPO2 100

## 2022-09-01 NOTE — Progress Notes (Signed)
An USGPIV (ultrasound guided PIV) 20G x 1.88" on RFA has been placed for short-term vasopressor infusion. A correctly placed ivWatch must be used when administering Vasopressors. Should this treatment be needed beyond 72 hours, central line access should be obtained.  It will be the responsibility of the bedside nurse to follow best practice to prevent extravasations.   

## 2022-09-02 DIAGNOSIS — B961 Klebsiella pneumoniae [K. pneumoniae] as the cause of diseases classified elsewhere: Secondary | ICD-10-CM

## 2022-09-02 LAB — CBC WITH DIFFERENTIAL/PLATELET
Abs Immature Granulocytes: 0.01 10*3/uL (ref 0.00–0.07)
Basophils Absolute: 0 10*3/uL (ref 0.0–0.1)
Basophils Relative: 0 %
Eosinophils Absolute: 0 10*3/uL (ref 0.0–0.5)
Eosinophils Relative: 0 %
HCT: 35.5 % — ABNORMAL LOW (ref 36.0–46.0)
Hemoglobin: 12.3 g/dL (ref 12.0–15.0)
Immature Granulocytes: 0 %
Lymphocytes Relative: 14 %
Lymphs Abs: 0.4 10*3/uL — ABNORMAL LOW (ref 0.7–4.0)
MCH: 32.2 pg (ref 26.0–34.0)
MCHC: 34.6 g/dL (ref 30.0–36.0)
MCV: 92.9 fL (ref 80.0–100.0)
Monocytes Absolute: 0.2 10*3/uL (ref 0.1–1.0)
Monocytes Relative: 6 %
Neutro Abs: 2.5 10*3/uL (ref 1.7–7.7)
Neutrophils Relative %: 80 %
Platelets: 92 10*3/uL — ABNORMAL LOW (ref 150–400)
RBC: 3.82 MIL/uL — ABNORMAL LOW (ref 3.87–5.11)
RDW: 13.2 % (ref 11.5–15.5)
WBC: 3.1 10*3/uL — ABNORMAL LOW (ref 4.0–10.5)
nRBC: 0 % (ref 0.0–0.2)

## 2022-09-02 LAB — BASIC METABOLIC PANEL
Anion gap: 10 (ref 5–15)
BUN: 25 mg/dL — ABNORMAL HIGH (ref 8–23)
CO2: 26 mmol/L (ref 22–32)
Calcium: 8.1 mg/dL — ABNORMAL LOW (ref 8.9–10.3)
Chloride: 106 mmol/L (ref 98–111)
Creatinine, Ser: 0.66 mg/dL (ref 0.44–1.00)
GFR, Estimated: >60 mL/min (ref >60)
Glucose, Bld: 145 mg/dL — ABNORMAL HIGH (ref 70–99)
Potassium: 3.9 mmol/L (ref 3.5–5.1)
Sodium: 142 mmol/L (ref 135–145)

## 2022-09-02 LAB — BLOOD GAS, VENOUS
Acid-Base Excess: 4.7 mmol/L — ABNORMAL HIGH (ref 0.0–2.0)
pCO2, Ven: 37 mmHg — ABNORMAL LOW (ref 44–60)

## 2022-09-02 LAB — GLUCOSE, CAPILLARY
Glucose-Capillary: 127 mg/dL — ABNORMAL HIGH (ref 70–99)
Glucose-Capillary: 149 mg/dL — ABNORMAL HIGH (ref 70–99)
Glucose-Capillary: 138 mg/dL — ABNORMAL HIGH (ref 70–99)
Glucose-Capillary: 126 mg/dL — ABNORMAL HIGH (ref 70–99)
Glucose-Capillary: 117 mg/dL — ABNORMAL HIGH (ref 70–99)
Glucose-Capillary: 111 mg/dL — ABNORMAL HIGH (ref 70–99)

## 2022-09-02 LAB — PHOSPHORUS: Phosphorus: 4.9 mg/dL — ABNORMAL HIGH (ref 2.5–4.6)

## 2022-09-02 LAB — MAGNESIUM: Magnesium: 2 mg/dL (ref 1.7–2.4)

## 2022-09-02 MED ORDER — TRAZODONE HCL 50 MG PO TABS
50.0000 mg | ORAL_TABLET | Freq: Every day | ORAL | Status: DC
  Administered 2022-09-02: 50 mg via ORAL
  Filled 2022-09-02: qty 1

## 2022-09-02 MED ORDER — HALOPERIDOL LACTATE 5 MG/ML IJ SOLN
1.0000 mg | Freq: Once | INTRAMUSCULAR | Status: AC
  Administered 2022-09-02: 1 mg via INTRAVENOUS
  Filled 2022-09-02: qty 1

## 2022-09-02 MED ORDER — ENSURE ENLIVE PO LIQD
237.0000 mL | Freq: Three times a day (TID) | ORAL | Status: DC
  Administered 2022-09-02 – 2022-09-03 (×3): 237 mL via ORAL

## 2022-09-02 MED ORDER — IPRATROPIUM-ALBUTEROL 0.5-2.5 (3) MG/3ML IN SOLN
3.0000 mL | Freq: Two times a day (BID) | RESPIRATORY_TRACT | Status: DC
  Administered 2022-09-03: 3 mL via RESPIRATORY_TRACT
  Filled 2022-09-02: qty 3

## 2022-09-02 MED ORDER — ADULT MULTIVITAMIN W/MINERALS CH
1.0000 | ORAL_TABLET | Freq: Every day | ORAL | Status: DC
  Administered 2022-09-02 – 2022-09-03 (×2): 1 via ORAL
  Filled 2022-09-02: qty 1

## 2022-09-02 MED ORDER — FUROSEMIDE 10 MG/ML IJ SOLN: 40.0000 mg | Freq: Once | INTRAMUSCULAR | Status: DC

## 2022-09-02 NOTE — Progress Notes (Addendum)
Brother Freida Busman updated that patient moved to room 221 by this RN via phone conversation. Appreciative of update on patient transfer and care.   All personal belongings in room transferred with patient to 221.

## 2022-09-02 NOTE — Evaluation (Signed)
Clinical/Bedside Swallow Evaluation Patient Details  Name: Sierra Patterson MRN: [id number] Date of Birth: [birth year]  Today's Date: 09/02/2022 Time: SLP Start Time (ACUTE ONLY): 1350 SLP Stop Time (ACUTE ONLY): 1450 SLP Time Calculation (min) (ACUTE ONLY): 60 min  Past Medical History:  Past Medical History:  Diagnosis Date   Cirrhosis (HCC)    Past Surgical History:  Past Surgical History:  Procedure Laterality Date   TRACHEOSTOMY TUBE PLACEMENT N/A 08/24/2022   Procedure: NASAL INTUBATION;  Surgeon: Linus Salmons, MD;  Location: [facility] ORS;  Service: ENT;  Laterality: N/A;   HPI:  Pt is a 62 y.o. female who presents with right sided laryngeal and right submandibular edema s/p a "bee sting" per pt as she was gardening, concerning for possible Ludwig's Angina, requiring Nasotracheal intubation by ENT for airway protection.  She began to notice some swelling over the right side of her face with some neck soreness.  This morning the swelling worsened prompting her to seek evaluation in the ED.  She reported that is now becoming difficult to swallow and feels as if her throat is swelling and starting to close then.  She denied any ACE inhibitor use, fever, chills, rash, chest pain, shortness of breath.  Past medical history significant for COPD, hypertension, cirrhosis due to hepatitis C.  CXR: No acute abnormality noted.    Assessment / Plan / Recommendation  Clinical Impression   Pt seen for BSE today s/p extubation yesterday. Pt was intubated for airway protection s/p potential "bee sting" and pharyngeal edema. Pt awake, talkative and conversed easily w/ this SLP. Mildly distracted but still on Precedex which NSG is weaning today. Pt followed commands w/ min cue -- appeared min weak but engaged appropriately w/ all tasks. Eager to have something to drink/eat.  Pt on [place] O2 support of 2L; afebrile. WBC wnl.  Pt appears to present w/ functional oropharyngeal phase swallow w/ No oropharyngeal  phase dysphagia noted, No neuromuscular deficits noted. Pt is currently Edentulous w/out her Dentures which Family will bring in. Pt consumed po trials given this evaluation w/ No immediate, overt clinical s/s of aspiration during po trials.  Pt appears at reduced risk for aspiration following general aspiration precautions w/ a modified diet currently.  However, pt does have challenging factors that could impact her oropharyngeal swallowing to include recent lengthy oral intubation, Edentulous status currently, fatigue/weakness impacting her UE strength/stamina for self-feeding, and weaning from sedating medications. These factors can increase risk for dysphagia, aspiration as well as decreased oral intake overall.   During po trials, pt consumed consistencies given w/ no overt coughing, decline in vocal quality, or change in respiratory presentation during/post trials. O2 sats 98%. Oral phase appeared [facility] w/ timely bolus management and control of bolus propulsion for A-P transfer for swallowing. Oral clearing achieved w/ all trials. No solids attempted until Dentures brought in -- pt eats w/ her Dentures in place at Baseline.  OM Exam appeared [facility] w/ no unilateral weakness noted. Speech Clear. Pt helped to feed self w/ setup and support.   Recommend initiation of a PUREED consistency diet w/ well-moistened foods until Dentures are brought in; Thin liquids. Pt should help to Hold Cup when drinking. Recommend general aspiration precautions, tray setup and support w/ feeding at meals d/t weakness. Pills CRUSHED vs WHOLE in Puree for safer, easier swallowing.  Education given on Pills in Puree; food consistencies and easy to eat options; general aspiration precautions to pt and NSG. ST services will f/u next 1-2  days for trials to upgrade diet when Dentures are present. MD/NSG updated, agreed. Recommend Dietician f/u for support. SLP Visit Diagnosis: Dysphagia, unspecified (R13.10)    Aspiration Risk    (reduced following general aspiration precautions)    Diet Recommendation   initiation of a PUREED consistency diet w/ well-moistened foods until Dentures are brought in; Thin liquids. Pt should help to Hold Cup when drinking. Recommend general aspiration precautions, tray setup and support w/ feeding at meals d/t weakness.  Medication Administration: Whole meds with puree (vs need to CRUSH in puree)    Other  Recommendations Recommended Consults:  (Dietician f/u) Oral Care Recommendations: Oral care BID;Oral care before and after PO;Staff/trained caregiver to provide oral care (Denture care)    Recommendations for follow up therapy are one component of a multi-disciplinary discharge planning process, led by the attending physician.  Recommendations may be updated based on patient status, additional functional criteria and insurance authorization.  Follow up Recommendations No SLP follow up (at Discharge expected)      Assistance Recommended at Discharge  PRN expected  Functional Status Assessment Patient has had a recent decline in their functional status and demonstrates the ability to make significant improvements in function in a reasonable and predictable amount of time.  Frequency and Duration min 1 x/week  1 week       Prognosis Prognosis for improved oropharyngeal function: Good Barriers to Reach Goals: Time post onset;Severity of deficits Barriers/Prognosis Comment: none      Swallow Study   General Date of Onset: 08/24/22 HPI: Pt is a 62 y.o. female who presents with right sided laryngeal and right submandibular edema s/p a "bee sting" per pt as she was gardening, concerning for possible Ludwig's Angina, requiring Nasotracheal intubation by ENT for airway protection.  She began to notice some swelling over the right side of her face with some neck soreness.  This morning the swelling worsened prompting her to seek evaluation in the ED.  She reported that is now becoming  difficult to swallow and feels as if her throat is swelling and starting to close then.  She denied any ACE inhibitor use, fever, chills, rash, chest pain, shortness of breath.  Past medical history significant for COPD, hypertension, cirrhosis due to hepatitis C.  CXR: No acute abnormality noted. Type of Study: Bedside Swallow Evaluation Previous Swallow Assessment: none Diet Prior to this Study: NPO (regular at home) Temperature Spikes Noted: No (wbc 3.1) Respiratory Status: Nasal cannula (2L) History of Recent Intubation: Yes Total duration of intubation (days): 7 days Date extubated: 09/01/22 Behavior/Cognition: Alert;Cooperative;Pleasant mood;Distractible;Requires cueing (min) Oral Cavity Assessment: Within Functional Limits Oral Care Completed by SLP: Yes Oral Cavity - Dentition: Edentulous (family can bring Dentures in) Vision: Functional for self-feeding Self-Feeding Abilities: Needs assist;Needs set up (weak UEs) Patient Positioning: Upright in bed (needed positioning support) Baseline Vocal Quality: Normal (wnl) Volitional Cough: Strong Volitional Swallow: Able to elicit    Oral/Motor/Sensory Function Overall Oral Motor/Sensory Function: Within functional limits   Ice Chips Ice chips: Within functional limits Presentation: Spoon (fed; 10+ trials)   Thin Liquid Thin Liquid: Within functional limits Presentation: Cup;Self Fed;Straw (~8 ozs total) Other Comments: water, juice    Nectar Thick Nectar Thick Liquid: Not tested   Honey Thick Honey Thick Liquid: Not tested   Puree Puree: Within functional limits Presentation: Self Fed;Spoon (fully supported; ~4 ozs)   Solid     Solid: Not tested Other Comments: edentulous - w/out her Dentures currently  Jerilynn Som, MS, CCC-SLP Speech Language Pathologist Rehab Services; [facility] [phone] (ascom) Sierra Patterson 09/02/2022,6:10 PM

## 2022-09-02 NOTE — Evaluation (Signed)
Occupational Therapy Evaluation Patient Details Name: Sierra Patterson MRN: [id number] DOB: May 23, 1960 Today's Date: 09/02/2022   History of Present Illness 62 y.o. 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   Clinical Impression   Sierra Patterson was seen for OT/PT co-evaluation this date. 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 Pt currently requires MOD A don/doff underwear bridging at bed level, MAX A pericare. MOD A x2 sup<>sit, poor static sitting balance/tolerance. Requires step by step cues however follow commands with repetition. Pt would benefit from skilled OT to address noted impairments and functional limitations (see below for any additional details). Upon hospital discharge, recommend follow up therapy.   Recommendations for follow up therapy are one component of a multi-disciplinary discharge planning process, led by the attending physician.  Recommendations may be updated based on patient status, additional functional criteria and insurance authorization.   Assistance Recommended at Discharge Frequent or constant Supervision/Assistance  Patient can return home with the following Two people to help with walking and/or transfers;Two people to help with bathing/dressing/bathroom;Help with stairs or ramp for entrance    Functional Status Assessment  Patient has had a recent decline in their functional status and demonstrates the ability to make significant improvements in function in a reasonable and predictable amount of time.  Equipment Recommendations  Other (comment) (defer)    Recommendations for Other Services       Precautions / Restrictions Precautions Precautions: Fall Restrictions Weight Bearing Restrictions: No      Mobility Bed Mobility Overal bed mobility: Needs  Assistance Bed Mobility: Supine to Sit, Sit to Supine     Supine to sit: Mod assist, +2 for physical assistance, HOB elevated Sit to supine: Mod assist, +2 for physical assistance        Transfers                   General transfer comment: unsafe to attempt      Balance Overall balance assessment: Needs assistance Sitting-balance support: Feet supported Sitting balance-Leahy Scale: Poor Sitting balance - Comments: CGA very briefly for static sitting Postural control: Posterior lean                                 ADL either performed or assessed with clinical judgement   ADL Overall ADL's : Needs assistance/impaired                                       General ADL Comments: MOD A don/doff underwear bridging at bed level, MAX A pericare. MIN A for phone use, pt holds earpiece near mouth      Pertinent Vitals/Pain Pain Assessment Pain Assessment: Faces Faces Pain Scale: No hurt     Hand Dominance     Extremity/Trunk Assessment Upper Extremity Assessment Upper Extremity Assessment: Generalized weakness   Lower Extremity Assessment Lower Extremity Assessment: Generalized weakness   Cervical / Trunk Assessment Cervical / Trunk Assessment: Normal   Communication Communication Communication: Expressive difficulties   Cognition Arousal/Alertness: Lethargic Behavior During Therapy: Flat affect Overall Cognitive Status: No family/caregiver present to determine baseline cognitive functioning  General Comments: follows one step commands with repetition and tactile cues                Home Living Family/patient expects to be discharged to:: Private residence Living Arrangements: Alone Available Help at Discharge: Family                         Home Equipment: None   Additional Comments: pt lethargic and poor historian      Prior Functioning/Environment Prior  Level of Function : Patient poor historian/Family not available             Mobility Comments: 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; ind ADLs Comments: independent        OT Problem List: Decreased range of motion;Decreased strength;Decreased activity tolerance;Impaired balance (sitting and/or standing);Decreased safety awareness      OT Treatment/Interventions: Self-care/ADL training;Therapeutic exercise;Energy conservation;DME and/or AE instruction;Therapeutic activities;Patient/family education;Balance training    OT Goals(Current goals can be found in the care plan section) Acute Rehab OT Goals Patient Stated Goal: to call Sierra dad OT Goal Formulation: With patient Time For Goal Achievement: 09/16/22 Potential to Achieve Goals: Good ADL Goals Pt Will Perform Grooming: standing;with min assist Pt Will Perform Lower Body Dressing: sit to/from stand;with min assist Pt Will Transfer to Toilet: with min assist;ambulating;bedside commode  OT Frequency: Min 2X/week    Co-evaluation PT/OT/SLP Co-Evaluation/Treatment: Yes Reason for Co-Treatment: For patient/therapist safety;To address functional/ADL transfers;Necessary to address cognition/behavior during functional activity PT goals addressed during session: Mobility/safety with mobility;Balance OT goals addressed during session: ADL's and self-care      AM-PAC OT "6 Clicks" Daily Activity     Outcome Measure Help from another person eating meals?: A Little Help from another person taking care of personal grooming?: A Little Help from another person toileting, which includes using toliet, bedpan, or urinal?: A Lot Help from another person bathing (including washing, rinsing, drying)?: A Lot Help from another person to put on and taking off regular upper body clothing?: A Little Help from another person to put on and taking off regular lower body clothing?: A Lot 6 Click Score: 15   End of Session    Activity Tolerance:  Patient limited by lethargy Patient left: in bed;with call bell/phone within reach;with bed alarm set  OT Visit Diagnosis: Other abnormalities of gait and mobility (R26.89);Muscle weakness (generalized) (M62.81)                Time: 1610-9604 OT Time Calculation (min): 14 min Charges:  OT General Charges $OT Visit: 1 Visit OT Evaluation $OT Eval Moderate Complexity: 1 Mod  Kathie Dike, M.S. OTR/L  09/02/22, 2:22 PM  ascom [phone]

## 2022-09-02 NOTE — Progress Notes (Signed)
Date of Admission:  08/24/2022    ID: Sierra Patterson is a 62 y.o. female Principal Problem:   Laryngeal edema Active Problems:   Acute hypoxic respiratory failure (HCC)   Ludwig's angina   Sepsis (HCC)   Gram-negative bacteremia    Subjective: Pt awake and alert Says she is feeling better Looking for her phone  Medications:   budesonide (PULMICORT) nebulizer solution  0.5 mg Nebulization BID   Chlorhexidine Gluconate Cloth  6 each Topical Nightly   clonazepam  0.5 mg Per Tube BID   enoxaparin (LOVENOX) injection  40 mg Subcutaneous Q24H   insulin aspart  0-15 Units Subcutaneous Q4H   ipratropium-albuterol  3 mL Nebulization BID   methylPREDNISolone (SOLU-MEDROL) injection  40 mg Intravenous Q12H    Objective: Vital signs in last 24 hours: Patient Vitals for the past 24 hrs:  BP Temp Temp src Pulse Resp SpO2 Weight  09/02/22 1000 129/76 -- -- 61 16 98 % --  09/02/22 0900 (!) 141/90 -- -- 66 12 97 % --  09/02/22 0822 -- -- -- -- -- 98 % --  09/02/22 0800 134/83 98 F (36.7 C) Axillary 61 (!) 22 96 % --  09/02/22 0700 130/89 -- -- 62 (!) 22 98 % --  09/02/22 0630 130/84 -- -- 63 (!) 21 98 % --  09/02/22 0600 126/78 -- -- 64 (!) 23 96 % --  09/02/22 0530 130/77 -- -- 64 (!) 24 97 % --  09/02/22 0500 130/84 -- -- 62 18 98 % --  09/02/22 0431 -- -- -- -- -- -- 90.2 kg  09/02/22 0430 120/72 -- -- 64 (!) 27 98 % --  09/02/22 0400 124/76 -- -- 67 (!) 27 94 % --  09/02/22 0330 134/84 -- -- 63 (!) 25 97 % --  09/02/22 0300 125/79 97.8 F (36.6 C) Axillary 70 (!) 21 96 % --  09/02/22 0230 124/78 -- -- 66 (!) 26 96 % --  09/02/22 0200 112/70 -- -- 69 (!) 29 94 % --  09/02/22 0130 111/69 -- -- 70 (!) 28 96 % --  09/02/22 0100 117/71 -- -- 70 (!) 29 97 % --  09/02/22 0001 (!) 161/98 -- -- 66 14 99 % --  09/02/22 0000 (!) 161/98 -- -- 69 20 98 % --  09/01/22 2300 131/81 98 F (36.7 C) -- 66 (!) 26 98 % --  09/01/22 2230 129/69 -- -- 72 (!) 29 96 % --  09/01/22 2200 (!)  156/89 -- -- 69 14 100 % --  09/01/22 2152 -- -- -- -- -- -- 90.2 kg  09/01/22 2130 98/64 -- -- 66 (!) 26 98 % --  09/01/22 2100 116/70 -- -- 70 (!) 30 97 % --  09/01/22 2030 (!) 138/95 -- -- 79 (!) 30 96 % --  09/01/22 2000 (!) 125/93 -- -- 79 14 97 % --  09/01/22 1930 117/72 -- -- 67 19 99 % --  09/01/22 1907 -- 98.1 F (36.7 C) Axillary -- -- -- --  09/01/22 1900 122/79 -- -- 63 (!) 23 97 % --  09/01/22 1845 114/85 -- -- 66 15 97 % --  09/01/22 1833 -- -- -- 63 (!) 27 100 % --  09/01/22 1830 97/63 -- -- 63 (!) 26 99 % --  09/01/22 1815 94/60 -- -- 65 (!) 27 99 % --  09/01/22 1800 96/64 -- -- 65 (!) 26 99 % --  09/01/22 1745 106/66 -- --  66 (!) 25 98 % --  09/01/22 1730 118/75 -- -- 67 20 98 % --  09/01/22 1715 121/78 -- -- 70 (!) 33 97 % --  09/01/22 1714 121/78 -- -- 69 (!) 25 98 % --  09/01/22 1700 (!) 115/95 -- -- 69 (!) 25 100 % --  09/01/22 1645 120/69 -- -- 67 (!) 25 99 % --  09/01/22 1630 132/76 -- -- 65 (!) 24 100 % --  09/01/22 1625 -- -- -- -- -- 100 % --  09/01/22 1615 130/80 -- -- 64 (!) 25 99 % --  09/01/22 1600 106/66 -- -- 61 (!) 28 99 % --  09/01/22 1545 105/67 -- -- 60 (!) 28 98 % --  09/01/22 1535 -- 99 F (37.2 C) Axillary 62 (!) 25 99 % --  09/01/22 1530 122/66 -- -- 63 (!) 31 99 % --  09/01/22 1515 (!) 90/53 -- -- 60 (!) 31 98 % --  09/01/22 1500 (!) 83/53 -- -- (!) 58 (!) 31 96 % --  09/01/22 1445 (!) 75/43 -- -- 60 (!) 33 97 % --  09/01/22 1430 (!) 74/48 -- -- 61 (!) 32 96 % --  09/01/22 1415 123/73 -- -- 65 (!) 26 93 % --  09/01/22 1400 121/88 -- -- 79 (!) 41 96 % --  09/01/22 1345 (!) 151/82 -- -- 83 (!) 29 94 % --  09/01/22 1335 -- -- -- 73 (!) 40 98 % --  09/01/22 1330 (!) 164/97 -- -- (!) 102 (!) 28 97 % --  09/01/22 1315 125/77 -- -- 77 (!) 39 95 % --  09/01/22 1300 (!) 158/79 -- -- 97 (!) 52 95 % --  09/01/22 1245 (!) 165/90 -- -- 96 (!) 41 (!) 88 % --  09/01/22 1241 -- (!) 100.5 F (38.1 C) Axillary 100 (!) 29 (!) 87 % --  09/01/22 1230 (!)  196/122 -- -- (!) 105 (!) 40 (!) 89 % --  09/01/22 1229 -- -- -- (!) 111 (!) 38 90 % --  09/01/22 1215 (!) 177/158 -- -- 99 (!) 50 (!) 88 % --  09/01/22 1201 (!) 179/103 -- -- (!) 103 (!) 32 (!) 85 % --  09/01/22 1200 (!) 192/106 -- -- (!) 102 17 (!) 86 % --  09/01/22 1145 (!) 178/102 -- -- (!) 104 (!) 29 93 % --     PHYSICAL EXAM:  General:awake, alert edentulous Neck: swelling resolved Lungs: b/l air entry Heart: s1s2 Abdomen: Soft, non-tender,not distended. Bowel sounds normal. No masses Extremities: spider veins over legs Skin: scabbed excoriation/superficial scratch No wounds No injuries Neurologic: moves all limbs  Lab Results    Latest Ref Rng & Units 09/02/2022    7:26 AM 09/01/2022    5:41 AM 08/31/2022    3:38 AM  CBC  WBC 4.0 - 10.5 K/uL 3.1  4.2  3.2   Hemoglobin 12.0 - 15.0 g/dL 16.1  09.6  04.5   Hematocrit 36.0 - 46.0 % 35.5  35.5  30.7   Platelets 150 - 400 K/uL 92  84  71        Latest Ref Rng & Units 09/02/2022    7:26 AM 09/01/2022    5:41 AM 08/31/2022    3:38 AM  CMP  Glucose 70 - 99 mg/dL 409  88  811   BUN 8 - 23 mg/dL 25  18  22    Creatinine 0.44 - 1.00 mg/dL 9.14  7.82  9.56  Sodium 135 - 145 mmol/L 142  140  143   Potassium 3.5 - 5.1 mmol/L 3.9  3.9  3.9   Chloride 98 - 111 mmol/L 106  109  109   CO2 22 - 32 mmol/L 26  21  28    Calcium 8.9 - 10.3 mg/dL 8.1  7.7  7.5       Microbiology: BC x4 pasteurella  Studies/Results: DG Chest Port 1 View  Result Date: 09/01/2022 CLINICAL DATA:  Hypoxia EXAM: PORTABLE CHEST 1 VIEW COMPARISON:  08/31/2022 FINDINGS: Endotracheal tube and gastric catheter are again seen and stable. Cardiac shadow is stable. Aortic calcifications are seen. Lungs are clear bilaterally. No focal infiltrate or effusion is seen. IMPRESSION: No acute abnormality noted.  Tubes and lines as described. Electronically Signed   By: Alcide Clever M.D.   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

## 2022-09-02 NOTE — Progress Notes (Signed)
Admission to unit from ICU

## 2022-09-02 NOTE — Progress Notes (Signed)
Brief Nutrition Follow-Up Note  Case discussed with SLP; pt been advanced to a dysphagia 1 diet with thin liquids. Per SLP, pt's family will be bringing her dentures in and diet will likely be advanced on Friday, 09/04/22.  Interventions:  -Ensure Enlive po TID, each supplement provides 350 kcal and 20 grams of protein -MVI with minerals daily  RD will continue to follow pt and adjust plan of care interventions as appropriate.   Levada Schilling, RD, LDN, CDCES Registered Dietitian II Certified Diabetes Care and Education Specialist Please refer to [facility] for RD and/or RD on-call/weekend/after hours pager

## 2022-09-02 NOTE — Progress Notes (Signed)
NAME:  Sierra Patterson, MRN:  [id number], DOB:  1960/12/13, LOS: 9 ADMISSION DATE:  08/24/2022, CONSULTATION DATE:  08/24/2022 REFERRING MD:  Dr. Rosalia Hammers, CHIEF COMPLAINT:  Facial swelling   Brief Pt Description / Synopsis:  62 y.o. 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  Found to have severe sepsis due to PASTEURELLA MULTOCIDA  Bacteremia & Klebsiella Pneumoniae Pneumonia.  History of Present Illness:  Sierra Patterson is a 62 year old female with a past medical history significant for COPD, hypertension, cirrhosis due to hepatitis C who presents to [facility] ED on 08/24/2022 due to complaints of right-sided facial swelling.  Patient is currently intubated and sedated and no family is currently available, therefore history is obtained from chart review.  Per ED and nursing notes the patient was working outside in her yard yesterday evening.  She did not notice any bug bites or bee stings, however yesterday been began to notice some swelling over the right side of her face with some neck soreness.  This morning the swelling worsened prompting her to seek evaluation in the ED.  She reported that is now becoming difficult to swallow and feels as if her throat is swelling and starting to close then.  She denied any ACE inhibitor use, fever, chills, rash, chest pain, shortness of breath, vomiting, nausea, vomiting, diarrhea, abdominal pain, dysuria.  ED Course: Initial Vital Signs: Temperature 100.1 F orally, respiratory rate 24, pulse 107, blood pressure 192/98, SpO2 95% on room air Significant Labs: Sodium 130, potassium 3.4, glucose 127, total bilirubin 2.3, lactic acid 1.4, procalcitonin 0.53, WBC 12.1 with neutrophilia COVID-19 PCR is negative Imaging CT soft tissue of the Neck>>IMPRESSION: Supraglottic laryngeal edema worse towards the right with airway narrowing. There  is also notable right submandibular space and gland inflammation, the primary infectious/inflammatory cause is uncertain. No collection. Medications Administered: IV Unasyn, 10 mg Decadron, 1 L LR  On exam she was noted to have swelling along the right lower face into the submandibular area with tenderness to palpation, along with some uvular swelling and asymmetric edema on the right side of the posterior oropharynx.  She did not exhibit any stridor or wheezing, but her voice was hoarse and somewhat muffled, she was able to manage secretions without issue.  She was evaluated by ENT who recommended intubating in the OR for airway protection.  PCCM is asked to admit for further workup and treatment postintubation.  Please see "significant hospital events" section below for full detailed hospital course.   Pertinent  Medical History   Past Medical History:  Diagnosis Date   Cirrhosis (HCC)     Micro Data:  5/27: Blood culture x2>> 4/4 bottles with PASTEURELLA MULTOCIDA  5/27: COVID-19/RSV/FLU PCR>>negative 5/27: RVP>> negative 5/27: Group A Strep PCR>>negative 5/27: Strep pneumo urinary antigen>>negative 5/29: Repeat Blood cultures x2>> no growth 6/1: Tracheal aspirate>>KLEBSIELLA PNEUMONIAE    Antimicrobials:   Anti-infectives (From admission, onward)    Start     Dose/Rate Route Frequency Ordered Stop   08/27/22 1330  Ampicillin-Sulbactam (UNASYN) 3 g in sodium chloride 0.9 % 100 mL IVPB        3 g 200 mL/hr over 30 Minutes Intravenous Every 6 hours 08/27/22 1238     08/25/22 1000  piperacillin-tazobactam (ZOSYN) IVPB 3.375 g  Status:  Discontinued        3.375 g 12.5 mL/hr over 240 Minutes Intravenous Every 8 hours 08/25/22  4098 08/27/22 1238   08/24/22 1800  Ampicillin-Sulbactam (UNASYN) 3 g in sodium chloride 0.9 % 100 mL IVPB  Status:  Discontinued        3 g 200 mL/hr over 30 Minutes Intravenous Every 6 hours 08/24/22 1033 08/25/22 0803   08/24/22 0915   Ampicillin-Sulbactam (UNASYN) 3 g in sodium chloride 0.9 % 100 mL IVPB        3 g 200 mL/hr over 30 Minutes Intravenous  Once 08/24/22 0914 08/24/22 1017        Significant Hospital Events: Including procedures, antibiotic start and stop dates in addition to other pertinent events   5/27: Presents to ED with right sided facial and throat swelling.  ENT intubated in OR for airway protection.   PCCM asked to admit 5/28: Blood cultures overnight with Gram - rods.  Will change ABX to Zosyn and consult ID. Obtain Echocardiogram.  Requiring low dose pressors (Levophed 2 mcg) 5/29: No acute events overnight.  Pt remains on minimal vent settings: PEEP 5/FiO2 28%. OGT changed to NGT per ENT recommendations will await ENT's recommendations regarding extubation following ENT airway examination  5/30: Increased peak pressures overnight, resolved with pt repositioning.  Per ENT recommendations CT Soft Tissue Neck with Contrast pending this afternoon to assess airway for possible extubation in the next 24 to 48hrs.  Pt remains on minimal vent settings   08/28/22- patient is with no acute events overnight. Reviewed medical plan with ENT no additional surgical intervention planned. ID with some concern post microbiology pasturella results, patient may need TEE. Cardiology will be consulted for this procedure.  08/29/22- patient for TEE then SBT for extubation 08/30/22- patient s/p cardiology evaluation , there is plan for TEE in am will keep patient on MV until this procedure is finished then can likely work on extubation.  6/3 remains nasally intubated plan for TEE 6/4 Underwent TEE, NO valve vegetation or endocarditis noted.  EXTUBATED following TEE. Requiring Precedex for agitation. 6/5:  Mentation improving, Calm on Precedex, continue to wean as tolerated.  Plan for PT/OT/Speech consults today.  Give 40 mg IV Lasix x1 dose.  Interim History / Subjective:  -Underwent TEE yesterday ~ no valve vegetation or  endocarditis noted -Extubated post TEE ~ following extubation respiratory status tenuous ~ required initiation of steroids, BD, and Lasix -UOP 3.2 L yesterday (net + 11.4L), Creatinine remains stable at 0.66 -Required Precedex overnight due to agitation ~ mentation much improved this am, calm on Precedex,  oriented x3, follows commands ~ wean as tolerated -On 2L [place], hemodynamically stable, no vasopressors -Plan for Speech/OT/PT consults today -Discussed with Dr. Belia Heman, will give 40 mg Lasix x1 dose today  Objective   Blood pressure 130/89, pulse 62, temperature 97.8 F (36.6 C), temperature source Axillary, resp. rate (!) 22, height 5\' 2"  (1.575 m), weight 90.2 kg, SpO2 98 %.    Vent Mode: PSV FiO2 (%):  [28 %] 28 % PEEP:  [5 cmH20] 5 cmH20 Pressure Support:  [5 cmH20] 5 cmH20   Intake/Output Summary (Last 24 hours) at 09/02/2022 0905 Last data filed at 09/02/2022 0857 Gross per 24 hour  Intake 1203.38 ml  Output 2500 ml  Net -1296.62 ml    Filed Weights   08/30/22 0415 09/01/22 2152 09/02/22 0431  Weight: 91.9 kg 90.2 kg 90.2 kg    Examination: General: Acute on chronically ill appearing female, laying in bed, calm on precedex in NAD HENT: Atraumatic, normocephalic, neck supple, no JVD Lungs: Clear throughout, no stridor,  even, non labored, normal effort Cardiovascular: NSR, s1s2, no m/r/g, no edema Abdomen: +BS x4, soft, non tender, non distended  Extremities: Normal bulk and tone, no deformities, no edema Neuro: Lightly sedated on Precedex (RASS 0), arouses to voice, oriented x3, moves all extremities to commands, no focal deficits noted, speech is very hoarse, pupils PERRL  GU: Purewick in place   Resolved Hospital Problem list     Assessment & Plan:   #Intubated for airway protection in setting of right sided laryngeal and submandibular space edema #Klebsiella Pneumoniae Pneumonia #COPD without acute exacerbation EXTUBATED 09/01/22 -Supplemental O2 as needed to maintain  O2 sats 88 to 92% -Follow intermittent Chest X-ray & ABG as needed -Bronchodilators & Pulmicort nebs -IV Steroids -ABX as above -Diuresis as BP and renal function permits ~ will give 40 mg IV Lasix x1 dose 6/5 -Pulmonary toilet as able  #Right sided Laryngeal and Submandibular space Edema, 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 No reported bee sting or bug bites and no associated hives or hypotension to suggest anaphylaxis rxn, does not take ACE-i CT Neck on presentation with supraglottic laryngeal edema (worse on right) with airway narrowing and notable right submandibular space and gland inflammation (but NO abscess collection and uncertain primary infectious/inflammatory etiology) - ENT following, appreciate input ~ recommends 48 hrs of ABX & steroids, and then repeating CT scan at that time - IV Pepcid  - Complement C4 within normal limits/ CRP elevated at 10.3   #Meets SIRS Criteria on presentation (HR >100, RR 22, WBC 12.1) #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 & Klebsiella Pneumoniae Pneumonia #Submandibular/Peritonsillar Infection & Ludwig's Angina -Monitor fever curve -Trend WBC's & Procalcitonin -Follow cultures as above -ID following, appreciate ~ Continue empiric Unaysn pending cultures & sensitivities as per ID -TEE 6/4 negative for valve vegetations or endocarditis  #Hypotension: septic shock and sedation related ~ RESOLVED PMHx: HTN Echo 08/25/22: 65 to 70%; pulmonary artery pressure mildly elevated, left atrial size severely dilated; moderate mitral valve regurgitation  - Continuous cardiac monitoring - Maintain MAP >65 - IV fluids - Vasopressors as needed to maintain MAP goal ~ weaned off - Lactic acid is normal (1.4) - Hold outpatient metoprolol  #Mild Hyponatremia~resolved  #Mild Hypokalemia~resolved  -Monitor I&O's / urinary output -Follow BMP -Ensure adequate renal perfusion -Avoid nephrotoxic agents as able -Replace electrolytes as indicated  #Chronic  Thrombocytopenia due to Cirrhosis ~ IMPROVING - Trend CBC  - Monitor for s/sx of bleeding  - Lovenox for VTE Prophylaxis  - Transfuse for Hgb <7 - Transfuse platelets for platelet count <50K with active bleeding  #Hyperglycemia due to critical illness exacerbated by high dose steroids Hemoglobin A1c 08/25/22: 5.0 - CBG's q4h; Target range of 140 to 180 - SSI - Follow ICU Hypo/Hyperglycemia protocol  #Acute Metabolic Encephalopathy due to severe sepsis and metabolic derangements UDS + for amphetamines, benzodiazepines, and tricyclics -Treatment of sepsis and metabolic derangements as outlined above -Provide supportive care -Promote normal sleep/wake cycle and family presence -Avoid sedating meds as able -Precedex as needed ~ wean as tolerated -Continue outpatient Clonazepam -PT/OT to mobilize    Best Practice (right click and "Reselect all SmartList Selections" daily)   Diet/type: NPO, Speech evaluation pending DVT prophylaxis: LMWH GI prophylaxis: H2B Lines: N/A Foley: N/A Code Status:  full code Last date of multidisciplinary goals of care discussion [09/02/22]  6/5: Pt updated at bedside.  Will update family when they arrive.   Labs   CBC: Recent Labs  Lab 08/26/22 1028 08/27/22 0450 08/29/22 0259 08/30/22  0700 08/31/22 0338 09/01/22 0541 09/02/22 0726  WBC 4.5   < > 3.8* 2.6* 3.2* 4.2 3.1*  NEUTROABS 3.7  --   --   --   --   --  2.5  HGB 11.7*   < > 13.6 10.9* 10.2* 11.8* 12.3  HCT 32.3*   < > 40.5 33.1* 30.7* 35.5* 35.5*  MCV 91.5   < > 96.9 98.2 97.8 97.5 92.9  PLT 85*   < > 68* 68* 71* 84* 92*   < > = values in this interval not displayed.     Basic Metabolic Panel: Recent Labs  Lab 08/29/22 0259 08/29/22 0544 08/30/22 0700 08/31/22 0338 09/01/22 0541 09/02/22 0726  NA  --  141 143 143 140 142  K  --  3.6 3.6 3.9 3.9 3.9  CL  --  109 109 109 109 106  CO2  --  26 27 28  21* 26  GLUCOSE  --  91 119* 118* 88 145*  BUN  --  31* 24* 22 18 25*   CREATININE  --  0.63 0.59 0.58 0.56 0.66  CALCIUM  --  7.9* 7.9* 7.5* 7.7* 8.1*  MG 2.4  --  2.1 2.1 2.0 2.0  PHOS  --  3.6 3.9 3.7 3.5 4.9*    GFR: Estimated Creatinine Clearance: 77.1 mL/min (by C-G formula based on SCr of 0.66 mg/dL). Recent Labs  Lab 08/27/22 0450 08/28/22 0658 08/30/22 0700 08/31/22 0338 09/01/22 0541 09/02/22 0726  PROCALCITON 2.23  --   --   --   --   --   WBC 3.3*   < > 2.6* 3.2* 4.2 3.1*   < > = values in this interval not displayed.     Liver Function Tests: Recent Labs  Lab 08/27/22 0450 08/28/22 0658 08/29/22 0544  ALBUMIN 2.6* 2.3* 2.1*    No results for input(s): "LIPASE", "AMYLASE" in the last 168 hours. No results for input(s): "AMMONIA" in the last 168 hours.  ABG    Component Value Date/Time   PHART 7.37 08/24/2022 1155   PCO2ART 39 08/24/2022 1155   PO2ART 107 08/24/2022 1155   HCO3 28.2 (H) 09/02/2022 0726   ACIDBASEDEF 2.5 (H) 08/24/2022 1155   O2SAT PENDING 09/02/2022 0726     Coagulation Profile: No results for input(s): "INR", "PROTIME" in the last 168 hours.   Cardiac Enzymes: No results for input(s): "CKTOTAL", "CKMB", "CKMBINDEX", "TROPONINI" in the last 168 hours.  HbA1C: Hgb A1c MFr Bld  Date/Time Value Ref Range Status  08/25/2022 05:46 AM 5.0 4.8 - 5.6 % Final    Comment:    (NOTE)         Prediabetes: 5.7 - 6.4         Diabetes: >6.4         Glycemic control for adults with diabetes: <7.0     CBG: Recent Labs  Lab 09/01/22 1630 09/01/22 1941 09/01/22 2357 09/02/22 0307 09/02/22 0743  GLUCAP 123* 127* 147* 149* 138*     Review of Systems:   Positives in BOLD: currently denies all complaints Gen: Denies fever, chills, weight change, fatigue, night sweats HEENT: Denies blurred vision, double vision, hearing loss, tinnitus, sinus congestion, rhinorrhea, sore throat, neck stiffness, dysphagia PULM: Denies shortness of breath, cough, sputum production, hemoptysis, wheezing CV: Denies chest  pain, edema, orthopnea, paroxysmal nocturnal dyspnea, palpitations GI: Denies abdominal pain, nausea, vomiting, diarrhea, hematochezia, melena, constipation, change in bowel habits GU: Denies dysuria, hematuria, polyuria, oliguria, urethral discharge Endocrine:  Denies hot or cold intolerance, polyuria, polyphagia or appetite change Derm: Denies rash, dry skin, scaling or peeling skin change Heme: Denies easy bruising, bleeding, bleeding gums Neuro: Denies headache, numbness, weakness, slurred speech, loss of memory or consciousness    Past Medical History:  She,  has a past medical history of Cirrhosis (HCC).   Surgical History:   Past Surgical History:  Procedure Laterality Date   TRACHEOSTOMY TUBE PLACEMENT N/A 08/24/2022   Procedure: NASAL INTUBATION;  Surgeon: Linus Salmons, MD;  Location: [facility] ORS;  Service: ENT;  Laterality: N/A;     Social History:   reports that she has been smoking cigarettes. She has been smoking an average of .5 packs per day. Her smokeless tobacco use includes snuff. She reports current alcohol use. She reports current drug use.   Family History:  Her family history is not on file.   Allergies Allergies  Allergen Reactions   Bee Venom Anaphylaxis and Swelling   Ibuprofen     REACTION: sick on stomach     Home Medications  Prior to Admission medications   Medication Sig Start Date End Date Taking? Authorizing Provider  clonazePAM (KLONOPIN) 0.5 MG tablet Take 1 tablet by mouth 2 (two) times daily. 08/23/12   [provider]  furosemide (LASIX) 20 MG tablet Take 1 tablet by mouth 2 (two) times daily. 11/25/12   [provider]  gabapentin (NEURONTIN) 300 MG capsule Take 1 capsule by mouth 3 (three) times daily. 08/23/12   [provider]  metoprolol tartrate (LOPRESSOR) 50 MG tablet Take 1 tablet by mouth 2 (two) times daily.  08/23/12   [provider]  naloxone [facility]) nasal spray 4 mg/0.1 mL Administer 1 spray  as needed into one nostril in the case of decreased breathing and responsiveness thought to be due to narcotics overdose.  Call 911 and repeat dose every 2 to 3 minutes in alternating nostrils until EMS arrives. 11/18/16   Loleta Rose, MD  OMEPRAZOLE PO Take 1 capsule by mouth 2 (two) times daily.    [provider]  SM MULTIPLE VITAMINS/IRON TABS Take 1 tablet by mouth daily. 08/08/10   [provider]     Critical care provider statement:   Total critical care time: 40 minutes   Harlon Ditty, AGACNP-BC [facility] Prefer epic messenger for cross cover needs If after hours, please call E-link

## 2022-09-02 NOTE — Consult Note (Signed)
PHARMACY CONSULT NOTE  Pharmacy Consult for Electrolyte Monitoring and Replacement   Recent Labs: Potassium (mmol/L)  Date Value  09/02/2022 3.9  01/10/2014 3.7   Magnesium (mg/dL)  Date Value  40/98/1191 2.0   Calcium (mg/dL)  Date Value  47/82/9562 8.1 (L)   Calcium, Total (mg/dL)  Date Value  13/10/6576 8.3 (L)   Albumin (g/dL)  Date Value  46/96/2952 2.1 (L)  01/10/2014 3.4   Phosphorus (mg/dL)  Date Value  84/13/2440 4.9 (H)   Sodium (mmol/L)  Date Value  09/02/2022 142  01/10/2014 142   Corrected Calcium: 8.82 mg/dL  Assessment: Patient presented with complaint of right sided lower jaw swelling. Side of face and neck are swollen. Pharmacy has been consulted to monitor and replace electrolytes under PCCM care.  Goal of Therapy:  Electrolytes WNL  Plan:  No current replacement indicated F/u with AM labs.   Elliot Gurney, PharmD, BCPS Clinical Pharmacist  09/02/2022 7:56 AM

## 2022-09-02 NOTE — Plan of Care (Signed)
      Problem: Education: Goal: Knowledge of General Education information will improve Description: Including pain rating scale, medication(s)/side effects and non-pharmacologic comfort measures Outcome: Progressing   Problem: Health Behavior/Discharge Planning: Goal: Ability to manage health-related needs will improve Outcome: Progressing   Problem: Clinical Measurements: Goal: Ability to maintain clinical measurements within normal limits will improve Outcome: Progressing Goal: Will remain free from infection Outcome: Progressing Goal: Diagnostic test results will improve Outcome: Progressing Goal: Respiratory complications will improve Outcome: Progressing Goal: Cardiovascular complication will be avoided Outcome: Progressing   Problem: Activity: Goal: Risk for activity intolerance will decrease Outcome: Progressing   Problem: Nutrition: Goal: Adequate nutrition will be maintained Outcome: Progressing   Problem: Coping: Goal: Level of anxiety will decrease Outcome: Progressing   Problem: Elimination: Goal: Will not experience complications related to bowel motility Outcome: Progressing Goal: Will not experience complications related to urinary retention Outcome: Progressing   Problem: Pain Managment: Goal: General experience of comfort will improve Outcome: Progressing   Problem: Safety: Goal: Ability to remain free from injury will improve Outcome: Progressing   Problem: Skin Integrity: Goal: Risk for impaired skin integrity will decrease Outcome: Progressing   Problem: Respiratory: Goal: Ability to maintain a clear airway and adequate ventilation will improve Outcome: Progressing   Problem: Role Relationship: Goal: Method of communication will improve Outcome: Progressing   Problem: Education: Goal: Ability to describe self-care measures that may prevent or decrease complications (Diabetes Survival Skills Education) will improve Outcome:  Progressing Goal: Individualized Educational Video(s) Outcome: Progressing   Problem: Coping: Goal: Ability to adjust to condition or change in health will improve Outcome: Progressing   Problem: Fluid Volume: Goal: Ability to maintain a balanced intake and output will improve Outcome: Progressing   Problem: Health Behavior/Discharge Planning: Goal: Ability to identify and utilize available resources and services will improve Outcome: Progressing Goal: Ability to manage health-related needs will improve Outcome: Progressing   Problem: Metabolic: Goal: Ability to maintain appropriate glucose levels will improve Outcome: Progressing   Problem: Nutritional: Goal: Maintenance of adequate nutrition will improve Outcome: Progressing Goal: Progress toward achieving an optimal weight will improve Outcome: Progressing   Problem: Skin Integrity: Goal: Risk for impaired skin integrity will decrease Outcome: Progressing   Problem: Tissue Perfusion: Goal: Adequacy of tissue perfusion will improve Outcome: Progressing

## 2022-09-02 NOTE — Evaluation (Signed)
Physical Therapy Evaluation Patient Details Name: Sierra Patterson MRN: [id number] DOB: 19-Oct-1960 Today's Date: 09/02/2022  History of Present Illness  62 y.o. 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   Clinical Impression  Patient received resting in bed, requests my assistance to call her father. Patient is very weak. Required mod-max +2 assist to sit up on side of bed. Mod A to maintain sitting at edge of bed. Posterior lean. She is unable to attempt standing at this time due to profound weakness. Patient will continue to benefit from skilled PT to improve strength, endurance and safety with mobility.         Recommendations for follow up therapy are one component of a multi-disciplinary discharge planning process, led by the attending physician.  Recommendations may be updated based on patient status, additional functional criteria and insurance authorization.  Follow Up Recommendations       Assistance Recommended at Discharge Frequent or constant Supervision/Assistance  Patient can return home with the following  Two people to help with walking and/or transfers;Two people to help with bathing/dressing/bathroom;Assist for transportation    Equipment Recommendations Other (comment) (TBD)  Recommendations for Other Services       Functional Status Assessment Patient has had a recent decline in their functional status and demonstrates the ability to make significant improvements in function in a reasonable and predictable amount of time.     Precautions / Restrictions Precautions Precautions: Fall Restrictions Weight Bearing Restrictions: No      Mobility  Bed Mobility Overal bed mobility: Needs Assistance Bed Mobility: Supine to Sit, Sit to Supine     Supine to sit: Mod assist, +2 for physical assistance, HOB elevated Sit to supine:  Mod assist, +2 for physical assistance   General bed mobility comments: patient very weak sitting edge of bed. Unable to sit unsupported for more than a few seconds.    Transfers                   General transfer comment: unable/unsafe to attempt    Ambulation/Gait               General Gait Details: unsafe to attempt  Stairs            Wheelchair Mobility    Modified Rankin (Stroke Patients Only)       Balance Overall balance assessment: Needs assistance Sitting-balance support: Feet supported Sitting balance-Leahy Scale: Poor Sitting balance - Comments: Requires assistance to maintain sitting balance Postural control: Posterior lean                                   Pertinent Vitals/Pain Pain Assessment Facial Expression: Relaxed, neutral Body Movements: Absence of movements Muscle Tension: Relaxed Compliance with ventilator (intubated pts.): N/A Vocalization (extubated pts.): Talking in normal tone or no sound CPOT Total: 0    Home Living Family/patient expects to be discharged to:: Private residence Living Arrangements: Alone Available Help at Discharge:  (reports she is basically living with her 9 year old father and taking care of him.)             Home Equipment: None      Prior Function Prior Level of Function : Independent/Modified Independent;Driving             Mobility Comments: independent ADLs Comments:  independent     Hand Dominance        Extremity/Trunk Assessment   Upper Extremity Assessment Upper Extremity Assessment: Defer to OT evaluation    Lower Extremity Assessment Lower Extremity Assessment: Generalized weakness    Cervical / Trunk Assessment Cervical / Trunk Assessment: Normal  Communication   Communication: Expressive difficulties;Other (comment) (garbled, difficult to understand at times)  Cognition Arousal/Alertness: Lethargic Behavior During Therapy: Flat  affect Overall Cognitive Status: No family/caregiver present to determine baseline cognitive functioning                                 General Comments: able to answer simple questions        General Comments      Exercises     Assessment/Plan    PT Assessment Patient needs continued PT services  PT Problem List Decreased strength;Decreased activity tolerance;Decreased balance;Decreased mobility;Decreased safety awareness;Decreased cognition;Decreased coordination       PT Treatment Interventions DME instruction;Gait training;Stair training;Functional mobility training;Therapeutic activities;Patient/family education;Cognitive remediation;Neuromuscular re-education;Balance training;Therapeutic exercise    PT Goals (Current goals can be found in the Care Plan section)  Acute Rehab PT Goals Patient Stated Goal: none stated PT Goal Formulation: Patient unable to participate in goal setting Time For Goal Achievement: 09/16/22    Frequency Min 3X/week     Co-evaluation PT/OT/SLP Co-Evaluation/Treatment: Yes Reason for Co-Treatment: For patient/therapist safety;To address functional/ADL transfers;Necessary to address cognition/behavior during functional activity PT goals addressed during session: Mobility/safety with mobility;Balance         AM-PAC PT "6 Clicks" Mobility  Outcome Measure Help needed turning from your back to your side while in a flat bed without using bedrails?: A Lot Help needed moving from lying on your back to sitting on the side of a flat bed without using bedrails?: A Lot Help needed moving to and from a bed to a chair (including a wheelchair)?: Total Help needed standing up from a chair using your arms (e.g., wheelchair or bedside chair)?: Total Help needed to walk in hospital room?: Total Help needed climbing 3-5 steps with a railing? : Total 6 Click Score: 8    End of Session Equipment Utilized During Treatment: Oxygen Activity  Tolerance: Patient limited by fatigue;Patient limited by lethargy Patient left: in bed;with call bell/phone within reach Nurse Communication: Mobility status PT Visit Diagnosis: Muscle weakness (generalized) (M62.81);Other abnormalities of gait and mobility (R26.89)    Time: 1610-9604 PT Time Calculation (min) (ACUTE ONLY): 14 min   Charges:   PT Evaluation $PT Eval Moderate Complexity: 1 Mod          Shanetta Nicolls, PT, GCS 09/02/22,1:54 PM

## 2022-09-02 NOTE — Plan of Care (Signed)
  Problem: Education: Goal: Knowledge of General Education information will improve Description: Including pain rating scale, medication(s)/side effects and non-pharmacologic comfort measures Outcome: Progressing   Problem: Health Behavior/Discharge Planning: Goal: Ability to manage health-related needs will improve Outcome: Progressing   Problem: Clinical Measurements: Goal: Ability to maintain clinical measurements within normal limits will improve Outcome: Progressing Goal: Will remain free from infection Outcome: Progressing Goal: Diagnostic test results will improve Outcome: Progressing Goal: Respiratory complications will improve Outcome: Not Progressing Goal: Cardiovascular complication will be avoided Outcome: Progressing   Problem: Activity: Goal: Risk for activity intolerance will decrease Outcome: Progressing   Problem: Nutrition: Goal: Adequate nutrition will be maintained Outcome: Not Progressing   Problem: Coping: Goal: Level of anxiety will decrease Outcome: Progressing   Problem: Elimination: Goal: Will not experience complications related to bowel motility Outcome: Not Progressing Goal: Will not experience complications related to urinary retention Outcome: Not Progressing   Problem: Pain Managment: Goal: General experience of comfort will improve Outcome: Progressing   Problem: Safety: Goal: Ability to remain free from injury will improve Outcome: Progressing   Problem: Skin Integrity: Goal: Risk for impaired skin integrity will decrease Outcome: Progressing   Problem: Activity: Goal: Ability to tolerate increased activity will improve Outcome: Completed/Met   Problem: Respiratory: Goal: Ability to maintain a clear airway and adequate ventilation will improve Outcome: Progressing   Problem: Role Relationship: Goal: Method of communication will improve Outcome: Progressing   Problem: Education: Goal: Ability to describe self-care  measures that may prevent or decrease complications (Diabetes Survival Skills Education) will improve Outcome: Not Progressing Goal: Individualized Educational Video(s) Outcome: Not Progressing   Problem: Coping: Goal: Ability to adjust to condition or change in health will improve Outcome: Not Progressing   Problem: Fluid Volume: Goal: Ability to maintain a balanced intake and output will improve Outcome: Progressing   Problem: Health Behavior/Discharge Planning: Goal: Ability to identify and utilize available resources and services will improve Outcome: Progressing Goal: Ability to manage health-related needs will improve Outcome: Progressing   Problem: Metabolic: Goal: Ability to maintain appropriate glucose levels will improve Outcome: Progressing   Problem: Nutritional: Goal: Maintenance of adequate nutrition will improve Outcome: Not Progressing Goal: Progress toward achieving an optimal weight will improve Outcome: Progressing   Problem: Skin Integrity: Goal: Risk for impaired skin integrity will decrease Outcome: Progressing   Problem: Tissue Perfusion: Goal: Adequacy of tissue perfusion will improve Outcome: Progressing Patient NPO combative with staff and refusing treatments

## 2022-09-02 NOTE — Progress Notes (Addendum)
2000 patient combative with staff trying to get out of bed not complying with medical treatment refusing to have breathing treatments complete ADL care done for patient. Max assist due to patient being combative. Patient will seem nice that punch or kick you so caution when in reach of legs and arms for safety, patient also verbally abusive ad cursing. Patient is alert to year month place and self.   09/02/22 0500 lab asked to collect labs patient not a nurse draw and Midline doesn't draw back blood.

## 2022-09-02 NOTE — Progress Notes (Signed)
Skin Check completed with Ale RN. No new pressure injuries or wounds found at this time

## 2022-09-03 ENCOUNTER — Other Ambulatory Visit: Payer: Self-pay

## 2022-09-03 DIAGNOSIS — J9601 Acute respiratory failure with hypoxia: Secondary | ICD-10-CM

## 2022-09-03 LAB — BASIC METABOLIC PANEL
Anion gap: 11 (ref 5–15)
BUN: 24 mg/dL — ABNORMAL HIGH (ref 8–23)
CO2: 24 mmol/L (ref 22–32)
Calcium: 8.2 mg/dL — ABNORMAL LOW (ref 8.9–10.3)
Chloride: 108 mmol/L (ref 98–111)
Creatinine, Ser: 0.64 mg/dL (ref 0.44–1.00)
GFR, Estimated: >60 mL/min (ref >60)
Glucose, Bld: 118 mg/dL — ABNORMAL HIGH (ref 70–99)
Potassium: 3.8 mmol/L (ref 3.5–5.1)
Sodium: 143 mmol/L (ref 135–145)

## 2022-09-03 LAB — CBC
HCT: 36.5 % (ref 36.0–46.0)
Hemoglobin: 12.1 g/dL (ref 12.0–15.0)
MCH: 32.1 pg (ref 26.0–34.0)
MCHC: 33.2 g/dL (ref 30.0–36.0)
MCV: 96.8 fL (ref 80.0–100.0)
Platelets: 116 10*3/uL — ABNORMAL LOW (ref 150–400)
RBC: 3.77 MIL/uL — ABNORMAL LOW (ref 3.87–5.11)
RDW: 13.4 % (ref 11.5–15.5)
WBC: 6.7 10*3/uL (ref 4.0–10.5)
nRBC: 0 % (ref 0.0–0.2)

## 2022-09-03 LAB — MAGNESIUM: Magnesium: 2.1 mg/dL (ref 1.7–2.4)

## 2022-09-03 LAB — PHOSPHORUS: Phosphorus: 3.4 mg/dL (ref 2.5–4.6)

## 2022-09-03 MED ORDER — FUROSEMIDE 20 MG PO TABS: 20.0000 mg | ORAL_TABLET | Freq: Two times a day (BID) | ORAL | 0 refills | Status: AC

## 2022-09-03 MED ORDER — AMOXICILLIN-POT CLAVULANATE 500-125 MG PO TABS: 1.0000 | ORAL_TABLET | Freq: Three times a day (TID) | ORAL | 0 refills | Status: DC

## 2022-09-03 MED ORDER — PREDNISONE 10 MG PO TABS
ORAL_TABLET | ORAL | 0 refills | Status: AC
  Filled 2022-09-03: qty 21, 6d supply, fill #0

## 2022-09-03 MED ORDER — METOPROLOL TARTRATE 50 MG PO TABS: 50.0000 mg | ORAL_TABLET | Freq: Two times a day (BID) | ORAL | 0 refills | Status: AC

## 2022-09-03 MED ORDER — TRAZODONE HCL 50 MG PO TABS: 50.0000 mg | ORAL_TABLET | Freq: Every day | ORAL | 0 refills | Status: DC

## 2022-09-03 MED ORDER — AMOXICILLIN-POT CLAVULANATE 500-125 MG PO TABS
1.0000 | ORAL_TABLET | Freq: Three times a day (TID) | ORAL | 0 refills | Status: AC
  Filled 2022-09-03: qty 15, 5d supply, fill #0

## 2022-09-03 MED ORDER — TRAZODONE HCL 50 MG PO TABS
50.0000 mg | ORAL_TABLET | Freq: Every day | ORAL | 0 refills | Status: AC
  Filled 2022-09-03: qty 30, 30d supply, fill #0

## 2022-09-03 MED ORDER — DOCUSATE SODIUM 100 MG PO CAPS
100.0000 mg | ORAL_CAPSULE | Freq: Two times a day (BID) | ORAL | 0 refills | Status: AC | PRN
  Filled 2022-09-03: qty 10, 5d supply, fill #0

## 2022-09-03 MED ORDER — ADULT MULTIVITAMIN W/MINERALS CH: 1.0000 | ORAL_TABLET | Freq: Every day | ORAL | 0 refills | Status: AC

## 2022-09-03 MED ORDER — PREDNISONE 10 MG (21) PO TBPK: ORAL_TABLET | ORAL | 0 refills | Status: DC

## 2022-09-03 MED ORDER — ENSURE ENLIVE PO LIQD: 237.0000 mL | Freq: Three times a day (TID) | ORAL | 12 refills | Status: DC

## 2022-09-03 MED ORDER — DOCUSATE SODIUM 100 MG PO CAPS: 100.0000 mg | ORAL_CAPSULE | Freq: Two times a day (BID) | ORAL | 0 refills | Status: DC | PRN

## 2022-09-03 MED ORDER — OMEPRAZOLE 20 MG PO CPDR: 20.0000 mg | DELAYED_RELEASE_CAPSULE | Freq: Two times a day (BID) | ORAL | 0 refills | Status: AC

## 2022-09-03 NOTE — Progress Notes (Addendum)
Oxygen Saturation Test   Oxygen Saturation at rest with Oxygen: 98%  2L O2 Oxygen Saturation at rest on RA: 96%  0L O2 Oxygen Saturation with exertion with Oxygen: 99% 2L O2 Oxygen Saturation with exertion on RA: 87% 0L O2

## 2022-09-03 NOTE — Progress Notes (Addendum)
Mobility Specialist - Progress Note   Pre-mobility:SpO2 (95) During mobility: SpO2(83) Post-mobility: SPO2(93)     09/03/22 1200  Mobility  Activity Ambulated with assistance in hallway  Level of Assistance Minimal assist, patient does 75% or more  Assistive Device Front wheel walker  Distance Ambulated (ft) 5 ft  Range of Motion/Exercises Active  Activity Response Tolerated well  Mobility Referral Yes  $Mobility charge 1 Mobility  Mobility Specialist Start Time (ACUTE ONLY) 1107  Mobility Specialist Stop Time (ACUTE ONLY) 1202  Mobility Specialist Time Calculation (min) (ACUTE ONLY) 55 min    Nurse requested Mobility Specialist to perform oxygen saturation test with pt which includes removing pt from oxygen both at rest and while ambulating.  Below are the results from that testing.     Patient Saturations on Room Air at Rest = spO2 95%  Patient Saturations on Room Air while Ambulating = sp02 83% .   Pt returned to sitting and performed pursed lip breathing for 5 minute with sp02 at 95%.  At end of testing pt left in room on RA (95%)  Reported results to nurse.    Pt resting in bed slid down on RA upon entry. Speech finishing up session with pt. Pt performs bed mobility MinA and had some trouble getting to EOB. Pt unable to maintain long sitting EOB without assistance; Bi-lateral weakness (right greater than left). Pt SpO2 95% (pre-mobility). Pt STS Min/ModA 2+ and ambulates in room ModA to maintain upright standing and stability. Pt had trouble moving right foot forward during ambulation. Pt o2 stat dropped to 84% during walk. Pt returned to recliner to take seated rest break (O2 returned to 93%) and pt left in recliner with needs in reach and chair alarm activated.   Sierra Patterson Mobility Specialist 09/03/22, 12:28 PM

## 2022-09-03 NOTE — Consult Note (Signed)
PHARMACY CONSULT NOTE  Pharmacy Consult for Electrolyte Monitoring and Replacement   Recent Labs: Potassium (mmol/L)  Date Value  09/03/2022 3.8  01/10/2014 3.7   Magnesium (mg/dL)  Date Value  16/12/9602 2.1   Calcium (mg/dL)  Date Value  54/11/8117 8.2 (L)   Calcium, Total (mg/dL)  Date Value  14/78/2956 8.3 (L)   Albumin (g/dL)  Date Value  21/30/8657 2.1 (L)  01/10/2014 3.4   Phosphorus (mg/dL)  Date Value  84/69/6295 3.4   Sodium (mmol/L)  Date Value  09/03/2022 143  01/10/2014 142   Assessment: Patient presented with complaint of right sided lower jaw swelling. Side of face and neck are swollen. Pharmacy has been consulted to monitor and replace electrolytes under PCCM care.  Goal of Therapy:  Electrolytes within normal limits  Plan:  --No electrolyte replacement indicated at this time --Patient care transferred from PCCM to [facility]. Will discontinue electrolyte consult at this time. Defer further ordering of labs and electrolyte replacement to primary team --Pharmacy will continue to follow along peripherally  Tressie Ellis 09/03/2022 7:35 AM

## 2022-09-03 NOTE — TOC Initial Note (Signed)
Transition of Care [facility]) - Initial/Assessment Note    Patient Details  Name: Sierra Patterson MRN: [id number] Date of Birth: [birth year]  Transition of [facility]) CM/SW Contact:    Chapman Fitch, RN Phone Number: 09/03/2022, 5:46 PM  Clinical Narrative:                 Patient to discharge today Met with patient and son [name] at bedside  Confirmed with patient registration that patient does not have active insurance coverage  Therapy recommending SNF.  Patient is not interested in SNF.  Patient and Son [name] confirm that son and brother will be able to provide 2 person assist in the home  Patient states that she has a RW, WC, and BSC in the home  Patient states that she no longer has a PCP, and is willing to get established at a sliding scale clinic.  [facility], info added to AVS  [facility].  [facility]erral accepted by [facility] with [facility].   Discharge medications filled are [facility] outpatient pharmacy and pharmacy confirmed that patient's brother picked up medications         Patient Goals and CMS Choice            Expected Discharge Plan and Services         Expected Discharge Date: 09/03/22                                    Prior Living Arrangements/Services                       Activities of Daily Living Home Assistive Devices/Equipment: None ADL Screening (condition at time of admission) Patient's cognitive ability adequate to safely complete daily activities?: Yes Is the patient deaf or have difficulty hearing?: No Does the patient have difficulty seeing, even when wearing glasses/contacts?: No Does the patient have difficulty concentrating, remembering, or making decisions?: No Patient able to express need for assistance with ADLs?: Yes Does the patient have difficulty dressing or bathing?: No Independently performs ADLs?: Yes (appropriate for developmental age) Does the  patient have difficulty walking or climbing stairs?: No Weakness of Legs: None Weakness of Arms/Hands: None  Permission Sought/Granted                  Emotional Assessment              Admission diagnosis:  Laryngeal edema [J38.4] Patient Active Problem List   Diagnosis Date Noted   Acute hypoxic respiratory failure (HCC) 08/25/2022   Ludwig's angina 08/25/2022   Sepsis (HCC) 08/25/2022   Gram-negative bacteremia 08/25/2022   Laryngeal edema 08/24/2022   CHRONIC HEPATITIS C WITHOUT MENTION HEPATIC COMA 01/26/2008   DEPRESSION 01/26/2008   ALLERGIC RHINITIS 01/26/2008   COPD 01/26/2008   RECTAL BLEEDING 01/26/2008   BACK PAIN, CHRONIC 01/26/2008   HEADACHE 01/26/2008   PCP:  Myrene Buddy, NP Pharmacy:   [id number] - [place], [place] - [address] AT [facility] [address] [facility] [address] [place] [address] Phone: [phone] Fax: [phone]  [facility] [address] [place] [address] Phone: [phone] Fax: [phone]     Social Determinants of Health (SDOH) Social History: SDOH Screenings   Food Insecurity: No Food Insecurity (08/24/2022)  Housing: Low Risk  (08/24/2022)  Transportation Needs: No Transportation Needs (08/24/2022)  Utilities: Not At Risk (08/24/2022)  Tobacco Use: High Risk (08/26/2022)   SDOH Interventions:     Readmission Risk Interventions     No data to display

## 2022-09-03 NOTE — Progress Notes (Signed)
Speech Language Pathology Treatment: Dysphagia  Patient Details Name: Sierra Patterson MRN: [id number] DOB: [birth year] Today's Date: 09/03/2022 Time: 1210-1250 SLP Time Calculation (min) (ACUTE ONLY): 40 min  Assessment / Plan / Recommendation Clinical Impression  Pt seen for ongoing assessment of swallowing today in hopes to upgrade her oral diet to soft solid foods. Pt awake, talkative and conversed easily w/ this SLP and Son stating she was eager "to go home today". Mildly distracted but had lengthy intubation and on Precedex which was just fully weaned yesterday. Pt followed all commands w/ min cue -- appeared weak in her UEs but engaged appropriately w/ all tasks and attempted to feed self. Sitting balance appeared weak.   Pt on RA sitting in chair; afebrile. WBC wnl.   Pt appears to present w/ functional oropharyngeal phase swallow w/ No oropharyngeal phase dysphagia noted, No neuromuscular deficits noted. Pt is Edentulous w/out her Dentures -- pt and Son endorsed she eats w/out the Dentures "often". Pt stated she wanted to proceed w/ eating soft solids w/out her Dentures. Pt consumed po trials given this evaluation w/ No overt clinical s/s of aspiration during po trials.  Pt appears at reduced risk for aspiration following general aspiration precautions w/ a modified diet currently.  However, pt does have challenging factors that could impact her oropharyngeal swallowing to include recent lengthy oral intubation, Edentulous status currently, fatigue/weakness impacting her UE strength/stamina for self-feeding. These factors can increase risk for dysphagia, aspiration as well as decreased oral intake overall.    During po trials, pt consumed soft solid foods and thin liquids via straw w/ no overt coughing, decline in vocal quality, or change in respiratory presentation during/post trials. Oral phase appeared [facility] w/ timely bolus management and control of bolus propulsion for A-P transfer for  swallowing. Mastication was grossly [facility] when soft solids were cut SMALL and MOISTENED well. This Education and model were given to the Son/pt for home use. Oral clearing achieved w/ all trials. Encouraged wearing her Dentures at home w/ meals.    Recommend a mech soft diet consistency w/ MEATS MINCED and well-moistened foods until Dentures are worn for mastication; Thin liquids. Pt should help to Hold Cup when drinking. Recommend general aspiration precautions, tray setup and support w/ feeding at meals d/t weakness. Pt should be monitored at meals for support for eating/drinking d/t UE weakness -- Son agreed. Pills CRUSHED vs WHOLE in Puree for safer, easier swallowing.  Education given on the above and Pills in Puree; food consistencies and easy to eat options; general aspiration precautions to pt, Son, and NSG. No further skilled ST services indicated. MD/NSG updated, agreed. NSG to reconsult ST services if new swallow needs arise during admit. Recommend Dietician f/u for support.     HPI HPI: Pt is a 62 y.o. female who presents with right sided laryngeal and right submandibular edema s/p a "bee sting" per pt as she was gardening, concerning for possible Ludwig's Angina, requiring Nasotracheal intubation by ENT for airway protection.  She began to notice some swelling over the right side of her face with some neck soreness.  This morning the swelling worsened prompting her to seek evaluation in the ED.  She reported that is now becoming difficult to swallow and feels as if her throat is swelling and starting to close then.  She denied any ACE inhibitor use, fever, chills, rash, chest pain, shortness of breath.  Past medical history significant for COPD, hypertension, cirrhosis due to hepatitis C.  CXR:  No acute abnormality noted.      SLP Plan  All goals met      Recommendations for follow up therapy are one component of a multi-disciplinary discharge planning process, led by the attending  physician.  Recommendations may be updated based on patient status, additional functional criteria and insurance authorization.    Recommendations  Diet recommendations: Dysphagia 3 (mechanical soft);Thin liquid (Meats MINCED w/ gravies) Liquids provided via: Cup;Straw (monitor) Medication Administration: Whole meds with puree (vs need to CRUSH in Puree) Supervision: Patient able to self feed;Staff to assist with self feeding;Full supervision/cueing for compensatory strategies (weak UEs) Compensations: Minimize environmental distractions;Slow rate;Small sips/bites;Lingual sweep for clearance of pocketing;Follow solids with liquid Postural Changes and/or Swallow Maneuvers: Out of bed for meals;Seated upright 90 degrees;Upright 30-60 min after meal                 (Dietician f/u) Oral care BID;Oral care before and after PO;Staff/trained caregiver to provide oral care (denture care)   Frequent or constant Supervision/Assistance (d/t weakness) Dysphagia, unspecified (R13.10)     All goals met       Jerilynn Som, MS, CCC-SLP Speech Language Pathologist Rehab Services; [facility] [phone] (ascom) Sierra Patterson  09/03/2022, 3:53 PM

## 2022-09-03 NOTE — Progress Notes (Signed)
Physical Therapy Treatment Patient Details Name: Sierra Patterson MRN: [id number] DOB: 15-Oct-1960 Today's Date: 09/03/2022   History of Present Illness 62 y.o. 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    PT Comments    Patient received in recliner, leaning to her left side. She is alert and agreeable to PT session. Patient is able to stand from recliner with mod cues for hand placement and min A x 2 reps. On initial stand patient found to be soiled with stool ( unaware) and was assisted to get cleaned and gown changed. Patient attempted to shift weight for marching in place while standing but is unable to clear either foot well from floor. She will continue to benefit from skilled PT to improve strength, safety and functional independence.     Recommendations for follow up therapy are one component of a multi-disciplinary discharge planning process, led by the attending physician.  Recommendations may be updated based on patient status, additional functional criteria and insurance authorization.  Follow Up Recommendations  Can patient physically be transported by private vehicle: No    Assistance Recommended at Discharge Frequent or constant Supervision/Assistance  Patient can return home with the following A lot of help with walking and/or transfers;A lot of help with bathing/dressing/bathroom;Assistance with cooking/housework;Assistance with feeding;Help with stairs or ramp for entrance;Direct supervision/assist for medications management;Assist for transportation   Equipment Recommendations  None recommended by PT    Recommendations for Other Services       Precautions / Restrictions Precautions Precautions: Fall Restrictions Weight Bearing Restrictions: No     Mobility  Bed Mobility               General bed mobility comments: NT  patient seated in recliner    Transfers Overall transfer level: Needs assistance Equipment used: Rolling walker (2 wheels) Transfers: Sit to/from Stand Sit to Stand: Min assist           General transfer comment: Able to stand from recliner x 2 with min A    Ambulation/Gait               General Gait Details: patient unable to clear either foot from floor in standing with RW and min guard   Stairs             Wheelchair Mobility    Modified Rankin (Stroke Patients Only)       Balance Overall balance assessment: Needs assistance Sitting-balance support: Feet supported Sitting balance-Leahy Scale: Fair Sitting balance - Comments: leaning to her left in sitting   Standing balance support: Bilateral upper extremity supported, During functional activity, Reliant on assistive device for balance Standing balance-Leahy Scale: Poor Standing balance comment: unable to clear feet in standing                            Cognition Arousal/Alertness: Awake/alert Behavior During Therapy: WFL for tasks assessed/performed Overall Cognitive Status: Within Functional Limits for tasks assessed                                 General Comments: follows one step commands with repetition and tactile cues        Exercises      General Comments        Pertinent Vitals/Pain Pain Assessment Pain Assessment:  No/denies pain    Home Living                          Prior Function            PT Goals (current goals can now be found in the care plan section) Acute Rehab PT Goals Patient Stated Goal: return home PT Goal Formulation: With patient Time For Goal Achievement: 09/16/22 Progress towards PT goals: Progressing toward goals    Frequency    Min 3X/week      PT Plan Current plan remains appropriate    Co-evaluation              AM-PAC PT "6 Clicks" Mobility   Outcome Measure  Help needed turning from  your back to your side while in a flat bed without using bedrails?: A Lot Help needed moving from lying on your back to sitting on the side of a flat bed without using bedrails?: A Lot Help needed moving to and from a bed to a chair (including a wheelchair)?: A Lot Help needed standing up from a chair using your arms (e.g., wheelchair or bedside chair)?: A Lot Help needed to walk in hospital room?: Total Help needed climbing 3-5 steps with a railing? : Total 6 Click Score: 10    End of Session Equipment Utilized During Treatment: Gait belt Activity Tolerance: Patient limited by fatigue Patient left: in chair;with call bell/phone within reach;with chair alarm set Nurse Communication: Mobility status PT Visit Diagnosis: Muscle weakness (generalized) (M62.81);Other abnormalities of gait and mobility (R26.89)     Time: 1610-9604 PT Time Calculation (min) (ACUTE ONLY): 27 min  Charges:  $Therapeutic Activity: 8-22 mins                     Kaysia Willard, PT, GCS 09/03/22,2:52 PM

## 2022-09-04 ENCOUNTER — Other Ambulatory Visit: Payer: Self-pay

## 2022-09-04 LAB — NOROVIRUS GROUP 1 & 2 BY PCR, STOOL

## 2022-09-04 LAB — ECHO TEE

## 2022-09-04 NOTE — Discharge Summary (Signed)
Physician Discharge Summary   Patient: Sierra Patterson MRN: [id number] DOB: [birth year]  Admit date:     08/24/2022  Discharge date: 09/03/2022  Discharge Physician: Delfino Lovett   PCP: Myrene Buddy, NP   Recommendations at discharge:    F/up with outpt providers as requested  Discharge Diagnoses: Principal Problem:   Laryngeal edema Active Problems:   Acute hypoxic respiratory failure (HCC)   Ludwig's angina   Sepsis (HCC)   Gram-negative bacteremia  Hospital Course: Assessment and Plan:  62 y.o. 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  Found to have severe sepsis due to PASTEURELLA MULTOCIDA  Bacteremia & Klebsiella Pneumoniae Pneumonia.   5/27: Presents to ED with right sided facial and throat swelling.  ENT intubated in OR for airway protection.   PCCM asked to admit 5/28: Blood cultures overnight with Gram - rods.  Will change ABX to Zosyn and consult ID. Obtain Echocardiogram.  Requiring low dose pressors (Levophed 2 mcg) 5/29: No acute events overnight.  Pt remains on minimal vent settings: PEEP 5/FiO2 28%. OGT changed to NGT per ENT recommendations will await ENT's recommendations regarding extubation following ENT airway examination  5/30: Increased peak pressures overnight, resolved with pt repositioning.  Per ENT recommendations CT Soft Tissue Neck with Contrast pending this afternoon to assess airway for possible extubation in the next 24 to 48hrs.  Pt remains on minimal vent settings   08/28/22- patient is with no acute events overnight. Reviewed medical plan with ENT no additional surgical intervention planned. ID with some concern post microbiology pasturella results, patient may need TEE. Cardiology will be consulted for this procedure.  08/29/22- patient for TEE then SBT for extubation 08/30/22- patient s/p cardiology evaluation ,  there is plan for TEE in am will keep patient on MV until this procedure is finished then can likely work on extubation.  6/3 remains nasally intubated plan for TEE 6/4 Underwent TEE, NO valve vegetation or endocarditis noted.  EXTUBATED following TEE. Requiring Precedex for agitation. 6/5:  Mentation improving, Calm on Precedex, continue to wean as tolerated.  Plan for PT/OT/Speech consults today.  Give 40 mg IV Lasix x1 dose. 6/6: patient adamant in leaving, tolerating diet. She refused SNF and family agreeable to keep her home  #Intubated for airway protection in setting of right sided laryngeal and submandibular space edema #Klebsiella Pneumoniae Pneumonia ruled out #COPD without acute exacerbation EXTUBATED 09/01/22 Klebsiella in tracheal apsirate is very likely colonization as no WBC or organism seen on gram stain per ID Also patient has been extubated and no evidence to suggest a new resp infection So no treatment needed per ID One epiose of fever could be due to extubation/tee   HEPC- active 13 million copies Will need treatment as OP, outpt f/up with ID Failed previous treatment with interferon/ribavarin   #Right sided Laryngeal and Submandibular space Edema, 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 No reported bee sting or bug bites and no associated hives or hypotension to suggest anaphylaxis rxn, does not take ACE-i CT Neck on presentation with supraglottic laryngeal edema (worse on right) with airway narrowing and notable right submandibular space and gland inflammation (but NO abscess collection and uncertain primary infectious/inflammatory etiology) - ENT seen and recommends outpt f/up and Abx   #Meets SIRS Criteria on presentation (HR >100, RR 22, WBC 12.1) #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 & Klebsiella Pneumoniae Pneumonia #Submandibular/Peritonsillar Infection & Ludwig's Angina -  treated with Unaysn while in the hospital -TEE 6/4 negative for valve vegetations or  endocarditis   #Hypotension: septic shock and sedation related ~ RESOLVED PMHx: HTN Echo 08/25/22: 65 to 70%; pulmonary artery pressure mildly elevated, left atrial size severely dilated; moderate mitral valve regurgitation  - Lactic acid is normal (1.4)   #Mild Hyponatremia~resolved  #Mild Hypokalemia~resolved    #Chronic Thrombocytopenia due to Cirrhosis ~ IMPROVING   #Hyperglycemia due to critical illness exacerbated by high dose steroids Hemoglobin A1c 08/25/22: 5.0   #Acute Metabolic Encephalopathy due to severe sepsis and metabolic derangements UDS + for amphetamines, benzodiazepines, and tricyclics - now resolved          Consultants: ENT, Pulmo, ID Disposition: Home health Diet recommendation:  Discharge Diet Orders (From admission, onward)     Start     Ordered   09/03/22 0000  Diet - low sodium heart healthy        09/03/22 1244           Carb modified diet DISCHARGE MEDICATION: Allergies as of 09/03/2022       Reactions   Bee Venom Anaphylaxis, Swelling   Ibuprofen    REACTION: sick on stomach        Medication List     STOP taking these medications    gabapentin 300 MG capsule Commonly known as: NEURONTIN   SM Multiple Vitamins/Iron Tabs       TAKE these medications    amoxicillin-clavulanate 500-125 MG tablet Commonly known as: Augmentin Take 1 tablet by mouth 3 (three) times daily for 5 days.   clonazePAM 0.5 MG tablet Commonly known as: KLONOPIN Take 1 tablet by mouth 2 (two) times daily.   docusate sodium 100 MG capsule Commonly known as: COLACE Take 1 capsule (100 mg total) by mouth 2 (two) times daily as needed for mild constipation.   furosemide 20 MG tablet Commonly known as: LASIX Take 1 tablet (20 mg total) by mouth 2 (two) times daily.   metoprolol tartrate 50 MG tablet Commonly known as: LOPRESSOR Take 1 tablet (50 mg total) by mouth 2 (two) times daily.   multivitamin with minerals Tabs tablet Take 1 tablet  by mouth daily.   naloxone 4 MG/0.1ML Liqd nasal spray kit Commonly known as: NARCAN Administer 1 spray as needed into one nostril in the case of decreased breathing and responsiveness thought to be due to narcotics overdose.  Call 911 and repeat dose every 2 to 3 minutes in alternating nostrils until EMS arrives.   omeprazole 20 MG capsule Commonly known as: PRILOSEC Take 1 capsule (20 mg total) by mouth 2 (two) times daily. What changed:  medication strength how much to take   predniSONE 10 MG tablet Commonly known as: DELTASONE 6 tablets (60 mg total) daily for 1 day, THEN 5 tablets (50 mg total) daily for 1 day, THEN 4 tablets (40 mg total) daily for 1 day, THEN 3 tablets (30 mg total) daily for 1 day, THEN 2 tablets (20 mg total) daily for 1 day, THEN 1 tablet (10 mg total) daily for 1 day. Start taking on: September 03, 2022   traZODone 50 MG tablet Commonly known as: DESYREL Take 1 tablet (50 mg total) by mouth at bedtime.        Follow-up Information     Lynn Ito, MD. Schedule an appointment as soon as possible for a visit in 2 week(s).   Specialty: Infectious Diseases Why: [facility] Discharge F/UP Contact information: [address]  [place] [address] [phone]         Erin Fulling, MD. Schedule an appointment as soon as possible for a visit in 3 week(s).   Specialties: Pulmonary Disease, Cardiology Why: [facility] Discharge F/UP Contact information: [address] [place] [address] [phone]         Linus Salmons, MD. Schedule an appointment as soon as possible for a visit in 3 week(s).   Specialty: Otolaryngology Why: [facility] Discharge F/UP Contact information: [address] [place] [address] [phone]         Shane Crutch, [place]. Go on 10/26/2022.   Specialty: Family Medicine Why: Arrive at [employer] ID, list of medications Contact information: [address]  [place] [address] [phone]                Discharge Exam: Ceasar Mons Weights   08/30/22 0415 09/01/22 2152 09/02/22 0431  Weight: 91.9 kg 90.2 kg 90.2 kg   General:awake, alert edentulous Neck: swelling resolved Lungs: b/l air entry Heart: s1s2 normal Abdomen: Soft, benign Extremities: spider veins over legs Skin: scabbed excoriation/superficial scratch Neurologic: alert, awake, non-focal  Condition at discharge: fair  The results of significant diagnostics from this hospitalization (including imaging, microbiology, ancillary and laboratory) are listed below for reference.   Imaging Studies: ECHO TEE  Result Date: 09/04/2022    TRANSESOPHOGEAL ECHO REPORT   Patient Name:   Sierra Patterson Date of Exam: 09/01/2022 Medical Rec #:  [id number]      Height:       62.0 in Accession #:    [id number]     Weight:       202.6 lb Date of Birth:  02-11-1961       BSA:          1.922 m Patient Age:    62 years       BP:           128/89 mmHg Patient Gender: F              HR:           70 bpm. Exam Location:  [facility] Procedure: Transesophageal Echo, Cardiac Doppler and Color Doppler Indications:     Bacteremia R78.81  History:         Patient has prior history of Echocardiogram examinations, most                  recent 09/25/2022. COPD.  Sonographer:     Cristela Blue Referring Phys:  [id number] CHRISTOPHER RONALD BERGE Diagnosing Phys: Julien Nordmann MD PROCEDURE: After discussion of the risks and benefits of a TEE, an informed consent was obtained from a family member. The patient was intubated. TEE procedure time was 30 minutes. The transesophogeal probe was passed without difficulty through the esophogus of the patient. Imaged were obtained with the patient in a left lateral decubitus and supine position. Local oropharyngeal anesthetic was provided with viscous lidocaine. Sedation performed by performing physician. Image quality was good. The patient's vital signs; including heart rate, blood  pressure, and oxygen saturation; remained stable throughout the procedure. The patient developed no complications during the procedure.  IMPRESSIONS  1. Left ventricular ejection fraction, by estimation, is 55 to 60%. The left ventricle has normal function. The left ventricle has no regional wall motion abnormalities.  2. Right ventricular systolic function is normal. The right ventricular size is normal.  3. Left atrial size was severely  dilated. No left atrial/left atrial appendage thrombus was detected.  4. The mitral valve is normal in structure. Moderate mitral valve regurgitation. No evidence of mitral stenosis.  5. The aortic valve is tricuspid. Aortic valve regurgitation is trivial. Aortic valve sclerosis/calcification is present, without any evidence of aortic stenosis.  6. The inferior vena cava is normal in size with greater than 50% respiratory variability, suggesting right atrial pressure of 3 mmHg.  7. No valve vegetation noted Conclusion(s)/Recommendation(s): Normal biventricular function without evidence of hemodynamically significant valvular heart disease. FINDINGS  Left Ventricle: Left ventricular ejection fraction, by estimation, is 55 to 60%. The left ventricle has normal function. The left ventricle has no regional wall motion abnormalities. The left ventricular internal cavity size was normal in size. There is  no left ventricular hypertrophy. Right Ventricle: The right ventricular size is normal. No increase in right ventricular wall thickness. Right ventricular systolic function is normal. Left Atrium: Left atrial size was severely dilated. No left atrial/left atrial appendage thrombus was detected. Right Atrium: Right atrial size was normal in size. Pericardium: There is no evidence of pericardial effusion. Mitral Valve: The mitral valve is normal in structure. Moderate mitral valve regurgitation, with eccentric posteriorly directed jet. No evidence of mitral valve stenosis. There is no  evidence of mitral valve vegetation. Tricuspid Valve: The tricuspid valve is normal in structure. Tricuspid valve regurgitation is not demonstrated. No evidence of tricuspid stenosis. There is no evidence of tricuspid valve vegetation. Aortic Valve: The aortic valve is tricuspid. Aortic valve regurgitation is trivial. Aortic valve sclerosis/calcification is present, without any evidence of aortic stenosis. There is no evidence of aortic valve vegetation. Pulmonic Valve: The pulmonic valve was normal in structure. Pulmonic valve regurgitation is not visualized. No evidence of pulmonic stenosis. There is no evidence of pulmonic valve vegetation. Aorta: The aortic root is normal in size and structure. Venous: The inferior vena cava is normal in size with greater than 50% respiratory variability, suggesting right atrial pressure of 3 mmHg. IAS/Shunts: No atrial level shunt detected by color flow Doppler. Julien Nordmann MD Electronically signed by Julien Nordmann MD Signature Date/Time: 09/04/2022/7:31:16 PM    Final    DG Chest Port 1 View  Result Date: 09/01/2022 CLINICAL DATA:  Hypoxia EXAM: PORTABLE CHEST 1 VIEW COMPARISON:  08/31/2022 FINDINGS: Endotracheal tube and gastric catheter are again seen and stable. Cardiac shadow is stable. Aortic calcifications are seen. Lungs are clear bilaterally. No focal infiltrate or effusion is seen. IMPRESSION: No acute abnormality noted.  Tubes and lines as described. Electronically Signed   By: Alcide Clever M.D.   On: 09/01/2022 02:43   DG Chest Port 1 View  Result Date: 08/31/2022 CLINICAL DATA:  Acute respiratory failure. EXAM: PORTABLE CHEST 1 VIEW COMPARISON:  08/26/2022. FINDINGS: Endotracheal tube tip now projects 1 cm above the carina. Enteric tube courses below the left hemidiaphragm, beyond the field of view. No consolidation or pulmonary edema. Stable cardiac and mediastinal contours. No pleural effusion or pneumothorax. IMPRESSION: Endotracheal tube tip now  projects 1 cm above the carina. Recommend retraction by 1 cm. Electronically Signed   By: Orvan Falconer M.D.   On: 08/31/2022 10:35   DG Abd 1 View  Result Date: 08/28/2022 CLINICAL DATA:  [id number] Other ascites [id number] EXAM: ABDOMEN - 1 VIEW COMPARISON:  Radiograph 08/25/2022 FINDINGS: Nasogastric tube tip and side port overlie the stomach. No evidence of bowel obstruction. No acute osseous abnormality. Metallic clips noted overlying the pelvis. IMPRESSION: No evidence of bowel obstruction.  Nasogastric tube tip and side port overlies the stomach. Electronically Signed   By: Caprice Renshaw M.D.   On: 08/28/2022 11:28   CT SOFT TISSUE NECK W CONTRAST  Result Date: 08/27/2022 CLINICAL DATA:  Soft tissue infection suspected, neck, xray done Assess submandibular region. EXAM: CT NECK WITH CONTRAST TECHNIQUE: Multidetector CT imaging of the neck was performed using the standard protocol following the bolus administration of intravenous contrast. RADIATION DOSE REDUCTION: This exam was performed according to the departmental dose-optimization program which includes automated exposure control, adjustment of the mA and/or kV according to patient size and/or use of iterative reconstruction technique. CONTRAST:  75mL OMNIPAQUE IOHEXOL 300 MG/ML  SOLN COMPARISON:  Neck CT 08/24/2022. FINDINGS: Pharynx and larynx: Endotracheal tube terminates near the carina. Retained secretions in the oral cavity and pharynx. Interval improvement in right oropharyngeal and supraglottic laryngeal edema. Salivary glands: Interval resolution of inflammation in the right submandibular space. Other salivary glands are unremarkable. Thyroid: Normal. Lymph nodes: No suspicious cervical lymphadenopathy. Vascular: Extensive atherosclerotic vascular changes of the carotid bulbs and proximal ICAs. Limited intracranial: Unremarkable. Visualized orbits: Unremarkable. Mastoids and visualized paranasal sinuses: Well aerated. Skeleton: Unremarkable. Upper  chest: Unremarkable. Other: None. IMPRESSION: 1. Interval improvement in right oropharyngeal and supraglottic laryngeal edema. Interval resolution of inflammation in the right submandibular space. 2. Endotracheal tube terminates near the carina. Retraction of about 2 cm is recommended to prevent mainstem bronchus intubation. Electronically Signed   By: Orvan Falconer M.D.   On: 08/27/2022 17:25   DG Chest Port 1 View  Result Date: 08/26/2022 CLINICAL DATA:  Acute respiratory failure EXAM: PORTABLE CHEST 1 VIEW COMPARISON:  Chest x-ray dated Aug 25, 2022 FINDINGS: Interval retraction of ETT with tip 2 cm from the carina. Enteric tube partially visualized coursing below the diaphragm. Heart size and mediastinal contours are within normal limits. Unchanged linear retrocardiac opacity of the left lung base, likely atelectasis. Lungs are otherwise clear. No pleural effusion or pneumothorax. IMPRESSION: 1. Interval retraction of ETT with tip 2 cm from the carina. 2. Unchanged left basilar atelectasis. Electronically Signed   By: Allegra Lai M.D.   On: 08/26/2022 08:51   DG Abd 1 View  Result Date: 08/25/2022 CLINICAL DATA:  Orogastric tube placement. EXAM: ABDOMEN - 1 VIEW COMPARISON:  Aug 24, 2022 FINDINGS: An orogastric tube is seen with its distal tip near the expected region of the gastric antrum. Its distal side hole is seen overlying the gastric body. The bowel gas pattern is normal. No radio-opaque calculi or other significant radiographic abnormality are seen. IMPRESSION: Orogastric tube positioning, as described above. Electronically Signed   By: Aram Candela M.D.   On: 08/25/2022 19:05   US Venous Img Upper Bilat (DVT)  Result Date: 08/25/2022 CLINICAL DATA:  Ludwig's angina EXAM: BILATERAL UPPER EXTREMITY VENOUS DOPPLER ULTRASOUND TECHNIQUE: Gray-scale sonography with graded compression, as well as color Doppler and duplex ultrasound were performed to evaluate the bilateral upper  extremity deep venous systems from the level of the subclavian vein and including the jugular, axillary, basilic, radial, ulnar and upper cephalic vein. Spectral Doppler was utilized to evaluate flow at rest and with distal augmentation maneuvers. COMPARISON:  None Available. FINDINGS: RIGHT UPPER EXTREMITY Internal Jugular Vein: No evidence of thrombus. Normal compressibility, respiratory phasicity and response to augmentation. Subclavian Vein: No evidence of thrombus. Normal compressibility, respiratory phasicity and response to augmentation. Axillary Vein: No evidence of thrombus. Normal compressibility, respiratory phasicity and response to augmentation. Cephalic Vein: No evidence of thrombus.  Normal compressibility, respiratory phasicity and response to augmentation. Basilic Vein: No evidence of thrombus. Normal compressibility, respiratory phasicity and response to augmentation. Brachial Veins: No evidence of thrombus. Normal compressibility, respiratory phasicity and response to augmentation. Radial Veins: No evidence of thrombus. Normal compressibility, respiratory phasicity and response to augmentation. Ulnar Veins: No evidence of thrombus. Normal compressibility, respiratory phasicity and response to augmentation. Venous Reflux:  None. Other Findings:  None. LEFT UPPER EXTREMITY Internal Jugular Vein: No evidence of thrombus. Normal compressibility, respiratory phasicity and response to augmentation. Subclavian Vein: No evidence of thrombus. Normal compressibility, respiratory phasicity and response to augmentation. Axillary Vein: No evidence of thrombus. Normal compressibility, respiratory phasicity and response to augmentation. Cephalic Vein: No evidence of thrombus. Normal compressibility, respiratory phasicity and response to augmentation. Basilic Vein: No evidence of thrombus. Normal compressibility, respiratory phasicity and response to augmentation. Brachial Veins: No evidence of thrombus. Normal  compressibility, respiratory phasicity and response to augmentation. Radial Veins: No evidence of thrombus. Normal compressibility, respiratory phasicity and response to augmentation. Ulnar Veins: No evidence of thrombus. Normal compressibility, respiratory phasicity and response to augmentation. Venous Reflux:  None. Other Findings:  None. IMPRESSION: No evidence of DVT within either upper extremity. Specifically the internal jugular veins are within normal limits. Electronically Signed   By: Alcide Clever M.D.   On: 08/25/2022 18:12   ECHOCARDIOGRAM COMPLETE  Result Date: 08/25/2022    ECHOCARDIOGRAM REPORT   Patient Name:   Sierra Patterson Date of Exam: 08/25/2022 Medical Rec #:  [id number]      Height:       62.0 in Accession #:    [id number]     Weight:       192.9 lb Date of Birth:  [birth year]       BSA:          1.883 m Patient Age:    62 years       BP:           82/53 mmHg Patient Gender: F              HR:           66 bpm. Exam Location:  [facility] Procedure: 2D Echo, Cardiac Doppler and Color Doppler Indications:     Bacteremia  History:         Patient has no prior history of Echocardiogram examinations.                  COPD; Signs/Symptoms:Bacteremia.  Sonographer:     Mikki Harbor Referring Phys:  [id number] Judithe Modest Diagnosing Phys: Yvonne Kendall MD  Sonographer Comments: Echo performed with patient supine and on artificial respirator. IMPRESSIONS  1. Left ventricular ejection fraction, by estimation, is 65 to 70%. The left ventricle has normal function. The left ventricle has no regional wall motion abnormalities. Left ventricular diastolic parameters are indeterminate.  2. Pulmonary artery pressure is at least mildly elevated (RVSP 30-35 mmHg plus central venous pressure). Right ventricular systolic function is normal. The right ventricular size is normal.  3. Left atrial size was severely dilated.  4. The mitral valve is abnormal. Moderate mitral valve regurgitation. No evidence of mitral  stenosis.  5. The aortic valve is tricuspid. There is mild calcification of the aortic valve. There is mild thickening of the aortic valve. Aortic valve regurgitation is not visualized. No aortic stenosis is present. FINDINGS  Left Ventricle: Left ventricular ejection fraction, by estimation, is 65 to 70%. The left ventricle has normal function.  The left ventricle has no regional wall motion abnormalities. The left ventricular internal cavity size was normal in size. There is  no left ventricular hypertrophy. Left ventricular diastolic parameters are indeterminate. Right Ventricle: Pulmonary artery pressure is at least mildly elevated (RVSP 30-35 mmHg plus central venous pressure). The right ventricular size is normal. No increase in right ventricular wall thickness. Right ventricular systolic function is normal. Left Atrium: Left atrial size was severely dilated. Right Atrium: Right atrial size was normal in size. Pericardium: There is no evidence of pericardial effusion. Mitral Valve: The mitral valve is abnormal. There is moderate thickening of the mitral valve leaflet(s). There is mild calcification of the mitral valve leaflet(s). Moderately decreased mobility of the mitral valve leaflets. Moderate mitral valve regurgitation. No evidence of mitral valve stenosis. MV peak gradient, 37.8 mmHg. The mean mitral valve gradient is 30.0 mmHg. Tricuspid Valve: The tricuspid valve is normal in structure. Tricuspid valve regurgitation is mild. Aortic Valve: The aortic valve is tricuspid. There is mild calcification of the aortic valve. There is mild thickening of the aortic valve. Aortic valve regurgitation is not visualized. No aortic stenosis is present. Aortic valve mean gradient measures 5.0 mmHg. Aortic valve peak gradient measures 11.4 mmHg. Aortic valve area, by VTI measures 2.23 cm. Pulmonic Valve: The pulmonic valve was not well visualized. Pulmonic valve regurgitation is not visualized. No evidence of pulmonic  stenosis. Aorta: The aortic root is normal in size and structure. Pulmonary Artery: The pulmonary artery is not well seen. Venous: The inferior vena cava was not well visualized. IAS/Shunts: No atrial level shunt detected by color flow Doppler.  LEFT VENTRICLE PLAX 2D LVIDd:         5.08 cm LVIDs:         2.58 cm LV PW:         0.89 cm LV IVS:        0.82 cm LVOT diam:     1.90 cm LV SV:         78 LV SV Index:   42 LVOT Area:     2.84 cm  RIGHT VENTRICLE RV Basal diam:  3.65 cm RV Mid diam:    3.40 cm RV S prime:     14.50 cm/s TAPSE (M-mode): 2.5 cm LEFT ATRIUM             Index        RIGHT ATRIUM           Index LA diam:        4.60 cm 2.44 cm/m   RA Area:     17.50 cm LA Vol (A2C):   73.0 ml 38.78 ml/m  RA Volume:   44.50 ml  23.64 ml/m LA Vol (A4C):   99.7 ml 52.96 ml/m LA Biplane Vol: 87.6 ml 46.53 ml/m  AORTIC VALVE                     PULMONIC VALVE AV Area (Vmax):    2.08 cm      PV Vmax:       1.32 m/s AV Area (Vmean):   2.25 cm      PV Peak grad:  7.0 mmHg AV Area (VTI):     2.23 cm AV Vmax:           169.00 cm/s AV Vmean:          103.000 cm/s AV VTI:            0.351  m AV Peak Grad:      11.4 mmHg AV Mean Grad:      5.0 mmHg LVOT Vmax:         124.00 cm/s LVOT Vmean:        81.800 cm/s LVOT VTI:          0.276 m LVOT/AV VTI ratio: 0.79  AORTA Ao Root diam: 2.90 cm MITRAL VALVE                TRICUSPID VALVE MV Area (PHT): 2.01 cm     TR Peak grad:   32.9 mmHg MV Area VTI:   0.69 cm     TR Vmax:        287.00 cm/s MV Peak grad:  37.8 mmHg MV Mean grad:  30.0 mmHg    SHUNTS MV Vmax:       3.08 m/s     Systemic VTI:  0.28 m MV Vmean:      215.8 cm/s   Systemic Diam: 1.90 cm MV Decel Time: 377 msec MV E velocity: 141.00 cm/s MV A velocity: 94.90 cm/s MV E/A ratio:  1.49 Yvonne Kendall MD Electronically signed by Yvonne Kendall MD Signature Date/Time: 08/25/2022/5:35:55 PM    Final    DG Chest Port 1 View  Result Date: 08/25/2022 CLINICAL DATA:  [id number] with hypoxic respiratory failure.  EXAM: PORTABLE CHEST 1 VIEW COMPARISON:  Portable chest yesterday at 11:51 a.m. FINDINGS: 5:25 a.m. ETT has been advanced with the tip now abutting the carina and should be withdrawn 5 cm to the mid trachea. NGT new insertion passes well into the stomach but neither the side-hole or tip are filmed. The heart is enlarged. There is increased vascular prominence and flow cephalization. There is also increased interstitial consolidation consistent with edema, with a basal gradient. Small pleural effusions are beginning to develop. There is increased linear left infrahilar atelectasis. No focal airspace infiltrate is seen. In all other respects there are no further changes. Calcification again noted transverse aorta. The mediastinum is normally outlined. IMPRESSION: 1. ETT tip abutting the carina and should be withdrawn 5 cm to the mid trachea. 2. NGT enters the stomach with the side hole and tip not filmed. 3. Increased vascular congestion and interstitial edema. 4. Small pleural effusions. 5. Increased left infrahilar atelectasis. 6. No further changes. Electronically Signed   By: Almira Bar M.D.   On: 08/25/2022 06:43   DG Abd 1 View  Result Date: 08/24/2022 CLINICAL DATA:  OG tube placement EXAM: ABDOMEN - 1 VIEW COMPARISON:  None Available. FINDINGS: Esophagogastric tube with tip and side port below the diaphragm. Nonobstructive pattern of included bowel gas. No free air. Cardiomegaly. IMPRESSION: Esophagogastric tube with tip and side port below the diaphragm. Electronically Signed   By: Jearld Lesch M.D.   On: 08/24/2022 17:01   DG Chest Port 1 View  Result Date: 08/24/2022 CLINICAL DATA:  62 year old female with history of respiratory arrest. EXAM: PORTABLE CHEST 1 VIEW COMPARISON:  Chest x-ray 01/10/2014. FINDINGS: An endotracheal tube is in place with tip 0.8 cm above the carina. Lung volumes are normal. No consolidative airspace disease. However, there is diffuse interstitial prominence and  widespread peribronchial cuffing. No pleural effusions. No pneumothorax. No pulmonary nodule or mass noted. Pulmonary vasculature and the cardiomediastinal silhouette are within normal limits. IMPRESSION: 1. Support apparatus, as above. Please take note of the low position of the endotracheal tube which is less than 1 cm above the carina, and consider withdrawal approximately  4 cm for more optimal placement. 2. Mild diffuse interstitial prominence and peribronchial cuffing, concerning for an acute bronchitis. Electronically Signed   By: Trudie Reed M.D.   On: 08/24/2022 12:22   CT Soft Tissue Neck W Contrast  Result Date: 08/24/2022 CLINICAL DATA:  Soft tissue infection suspected. 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 RADIATION DOSE REDUCTION: This exam was performed according to the departmental dose-optimization program which includes automated exposure control, adjustment of the mA and/or kV according to patient size and/or use of iterative reconstruction technique. CONTRAST:  50mL OMNIPAQUE IOHEXOL 300 MG/ML  SOLN COMPARISON:  08/11/22 FINDINGS: Pharynx and larynx: Pronounced supraglottic edema especially on the right with marked thickening of the right aryepiglottic fold. Related supraglottic airway narrowing. No collection. No detected sublingual edema. Salivary glands: Inflammation around the right submandibular gland which is expanded. No underlying stone or ductal dilatation. Thyroid: Negative Lymph nodes: Mild enlargement of cervical chain lymph nodes, expected Vascular: Atheromatous calcification.  No acute vascular finding Limited intracranial: Negative Visualized orbits: Negative Mastoids and visualized paranasal sinuses: Clear. Nasal septal perforation Skeleton: No acute or aggressive  finding. Upper chest: Negative IMPRESSION: Supraglottic laryngeal edema worse towards the right with airway narrowing. There is also notable right submandibular space and gland inflammation, the primary infectious/inflammatory cause is uncertain. No collection. Electronically Signed   By: Tiburcio Pea M.D.   On: 08/24/2022 08:59    Microbiology: Results for orders placed or performed during the hospital encounter of 08/24/22  Resp panel by RT-PCR (RSV, Flu A&B, Covid) Anterior Nasal Swab     Status: None   Collection Time: 08/24/22  9:24 AM   Specimen: Anterior Nasal Swab  Result Value Ref Range Status   SARS Coronavirus 2 by RT PCR NEGATIVE NEGATIVE Final    Comment: (NOTE) SARS-CoV-2 target nucleic acids are NOT DETECTED.  The SARS-CoV-2 RNA is generally detectable in upper respiratory specimens during the acute phase of infection. The lowest concentration of SARS-CoV-2 viral copies this assay can detect is 138 copies/mL. A negative result does not preclude SARS-Cov-2 infection and should not be used as the sole basis for treatment or other patient management decisions. A negative result may occur with  improper specimen collection/handling, submission of specimen other than nasopharyngeal swab, presence of viral mutation(s) within the areas targeted by this assay, and inadequate number of viral copies(<138 copies/mL). A negative result must be combined with clinical observations, patient history, and epidemiological information. The expected result is Negative.  Fact Sheet for Patients:  BloggerCourse.com  Fact Sheet for Healthcare Providers:  SeriousBroker.it  This test is no t yet approved or cleared by the [place] FDA and  has been authorized for detection and/or diagnosis of SARS-CoV-2 by FDA under an Emergency Use Authorization (EUA). This EUA will remain  in effect (meaning this test can be used) for the duration of  the COVID-19 declaration under Section 564(b)(1) of the Act, 21 U.S.C.section 360bbb-3(b)(1), unless the authorization is terminated  or revoked sooner.       Influenza A by PCR NEGATIVE NEGATIVE Final   Influenza B by PCR NEGATIVE NEGATIVE Final    Comment: (NOTE) The Xpert Xpress SARS-CoV-2/FLU/RSV plus assay is intended as an aid in the diagnosis of influenza from Nasopharyngeal swab specimens and should not be used as a sole basis for treatment. Nasal  washings and aspirates are unacceptable for Xpert Xpress SARS-CoV-2/FLU/RSV testing.  Fact Sheet for Patients: BloggerCourse.com  Fact Sheet for Healthcare Providers: SeriousBroker.it  This test is not yet approved or cleared by the [place] FDA and has been authorized for detection and/or diagnosis of SARS-CoV-2 by FDA under an Emergency Use Authorization (EUA). This EUA will remain in effect (meaning this test can be used) for the duration of the COVID-19 declaration under Section 564(b)(1) of the Act, 21 U.S.C. section 360bbb-3(b)(1), unless the authorization is terminated or revoked.     Resp Syncytial Virus by PCR NEGATIVE NEGATIVE Final    Comment: (NOTE) Fact Sheet for Patients: BloggerCourse.com  Fact Sheet for Healthcare Providers: SeriousBroker.it  This test is not yet approved or cleared by the [place] FDA and has been authorized for detection and/or diagnosis of SARS-CoV-2 by FDA under an Emergency Use Authorization (EUA). This EUA will remain in effect (meaning this test can be used) for the duration of the COVID-19 declaration under Section 564(b)(1) of the Act, 21 U.S.C. section 360bbb-3(b)(1), unless the authorization is terminated or revoked.  Performed at [facility], [address], [place], [place] [address]   Blood Culture (routine x 2)     Status: Abnormal   Collection Time:  08/24/22  9:24 AM   Specimen: BLOOD  Result Value Ref Range Status   Specimen Description   Final    BLOOD RIGHT [facility] Performed at [facility], [address] [place], [place] [address]    Special Requests   Final    BOTTLES DRAWN AEROBIC AND ANAEROBIC Blood Culture results may not be optimal due to an excessive volume of blood received in culture bottles Performed at [facility], [address], [place], [place] [address]    Culture  Setup Time   Final    GRAM NEGATIVE RODS IN BOTH AEROBIC AND ANAEROBIC BOTTLES CRITICAL VALUE NOTED.  VALUE IS CONSISTENT WITH PREVIOUSLY REPORTED AND CALLED VALUE.    Culture (A)  Final    PASTEURELLA MULTOCIDA Usually susceptible to penicillin and other beta lactam agents,quinolones,macrolides and tetracyclines. Performed at [facility], [address], [place], [place] [address]    Report Status 08/27/2022 FINAL  Final  Blood Culture (routine x 2)     Status: Abnormal   Collection Time: 08/24/22  9:24 AM   Specimen: BLOOD  Result Value Ref Range Status   Specimen Description   Final    BLOOD LEFT FA Performed at [facility], [address] [place], [place] [address]    Special Requests   Final    BOTTLES DRAWN AEROBIC AND ANAEROBIC Blood Culture results may not be optimal due to an excessive volume of blood received in culture bottles Performed at [facility], [address] [place], [place] [address]    Culture  Setup Time   Final    GRAM NEGATIVE RODS IN BOTH AEROBIC AND ANAEROBIC BOTTLES Organism ID to follow CRITICAL RESULT CALLED TO, READ BACK BY AND VERIFIED WITH: CAROLINE CHILDS 08/24/22 2041 MU    Culture (A)  Final    PASTEURELLA MULTOCIDA Usually susceptible to penicillin and other beta lactam agents,quinolones,macrolides and tetracyclines. Performed at [facility], [address], [place], [place] [address]    Report Status 08/27/2022 FINAL  Final  Blood Culture ID  Panel (Reflexed)     Status: None   Collection Time: 08/24/22  9:24 AM  Result Value Ref Range Status  Enterococcus faecalis NOT DETECTED NOT DETECTED Final   Enterococcus Faecium NOT DETECTED NOT DETECTED Final   Listeria monocytogenes NOT DETECTED NOT DETECTED Final   Staphylococcus species NOT DETECTED NOT DETECTED Final   Staphylococcus aureus (BCID) NOT DETECTED NOT DETECTED Final   Staphylococcus epidermidis NOT DETECTED NOT DETECTED Final   Staphylococcus lugdunensis NOT DETECTED NOT DETECTED Final   Streptococcus species NOT DETECTED NOT DETECTED Final   Streptococcus agalactiae NOT DETECTED NOT DETECTED Final   Streptococcus pneumoniae NOT DETECTED NOT DETECTED Final   Streptococcus pyogenes NOT DETECTED NOT DETECTED Final   A.calcoaceticus-baumannii NOT DETECTED NOT DETECTED Final   Bacteroides fragilis NOT DETECTED NOT DETECTED Final   Enterobacterales NOT DETECTED NOT DETECTED Final   Enterobacter cloacae complex NOT DETECTED NOT DETECTED Final   Escherichia coli NOT DETECTED NOT DETECTED Final   Klebsiella aerogenes NOT DETECTED NOT DETECTED Final   Klebsiella oxytoca NOT DETECTED NOT DETECTED Final   Klebsiella pneumoniae NOT DETECTED NOT DETECTED Final   Proteus species NOT DETECTED NOT DETECTED Final   Salmonella species NOT DETECTED NOT DETECTED Final   Serratia marcescens NOT DETECTED NOT DETECTED Final   Haemophilus influenzae NOT DETECTED NOT DETECTED Final   Neisseria meningitidis NOT DETECTED NOT DETECTED Final   Pseudomonas aeruginosa NOT DETECTED NOT DETECTED Final   Stenotrophomonas maltophilia NOT DETECTED NOT DETECTED Final   Candida albicans NOT DETECTED NOT DETECTED Final   Candida auris NOT DETECTED NOT DETECTED Final   Candida glabrata NOT DETECTED NOT DETECTED Final   Candida krusei NOT DETECTED NOT DETECTED Final   Candida parapsilosis NOT DETECTED NOT DETECTED Final   Candida tropicalis NOT DETECTED NOT DETECTED Final   Cryptococcus  neoformans/gattii NOT DETECTED NOT DETECTED Final    Comment: Performed at [facility], [address] [place], [place] [address]  MRSA Next Gen by PCR, Nasal     Status: None   Collection Time: 08/24/22 12:24 PM   Specimen: Nasal Mucosa; Nasal Swab  Result Value Ref Range Status   MRSA by PCR Next Gen NOT DETECTED NOT DETECTED Final    Comment: (NOTE) The GeneXpert MRSA Assay (FDA approved for NASAL specimens only), is one component of a comprehensive MRSA colonization surveillance program. It is not intended to diagnose MRSA infection nor to guide or monitor treatment for MRSA infections. Test performance is not FDA approved in patients less than 46 years old. Performed at [facility], [address] [place], [place] [address]   Respiratory (~20 pathogens) panel by PCR     Status: None   Collection Time: 08/24/22 12:49 PM   Specimen: Nasopharyngeal Swab; Respiratory  Result Value Ref Range Status   Adenovirus NOT DETECTED NOT DETECTED Final   Coronavirus 229E NOT DETECTED NOT DETECTED Final    Comment: (NOTE) The Coronavirus on the Respiratory Panel, DOES NOT test for the novel  Coronavirus (2019 nCoV)    Coronavirus HKU1 NOT DETECTED NOT DETECTED Final   Coronavirus NL63 NOT DETECTED NOT DETECTED Final   Coronavirus OC43 NOT DETECTED NOT DETECTED Final   Metapneumovirus NOT DETECTED NOT DETECTED Final   Rhinovirus / Enterovirus NOT DETECTED NOT DETECTED Final   Influenza A NOT DETECTED NOT DETECTED Final   Influenza B NOT DETECTED NOT DETECTED Final   Parainfluenza Virus 1 NOT DETECTED NOT DETECTED Final   Parainfluenza Virus 2 NOT DETECTED NOT DETECTED Final   Parainfluenza Virus 3 NOT DETECTED NOT DETECTED Final   Parainfluenza Virus 4 NOT DETECTED NOT DETECTED Final  Respiratory Syncytial Virus NOT DETECTED NOT DETECTED Final   Bordetella pertussis NOT DETECTED NOT DETECTED Final   Bordetella Parapertussis NOT DETECTED NOT DETECTED Final    Chlamydophila pneumoniae NOT DETECTED NOT DETECTED Final   Mycoplasma pneumoniae NOT DETECTED NOT DETECTED Final    Comment: Performed at [facility], [address], [place], [place] [address]  Group A Strep by PCR     Status: None   Collection Time: 08/24/22  3:49 PM   Specimen: Throat; Sterile Swab  Result Value Ref Range Status   Group A Strep by PCR NOT DETECTED NOT DETECTED Final    Comment: Performed at [facility], [address] [place], [place] [address]  Culture, blood (Routine X 2) w Reflex to ID Panel     Status: None   Collection Time: 08/26/22 12:45 PM   Specimen: BLOOD LEFT HAND  Result Value Ref Range Status   Specimen Description BLOOD LEFT HAND  Final   Special Requests   Final    BOTTLES DRAWN AEROBIC AND ANAEROBIC Blood Culture adequate volume   Culture   Final    NO GROWTH 5 DAYS Performed at [facility], [address] [place], [place] [address]    Report Status 08/31/2022 FINAL  Final  Culture, blood (Routine X 2) w Reflex to ID Panel     Status: None   Collection Time: 08/26/22  1:08 PM   Specimen: BLOOD  Result Value Ref Range Status   Specimen Description BLOOD BLOOD RIGHT HAND  Final   Special Requests   Final    BOTTLES DRAWN AEROBIC AND ANAEROBIC Blood Culture adequate volume   Culture   Final    NO GROWTH 5 DAYS Performed at [facility], [address] [place], [place] [address]    Report Status 08/31/2022 FINAL  Final  Culture, Respiratory w Gram Stain     Status: None   Collection Time: 08/29/22 11:16 PM   Specimen: Tracheal Aspirate; Respiratory  Result Value Ref Range Status   Specimen Description   Final    TRACHEAL ASPIRATE Performed at [facility], [address], [place], [place] [address]    Special Requests   Final    NONE Performed at [facility], [address] [place], [place] [address]    Gram Stain   Final    NO WBC SEEN NO ORGANISMS SEEN Performed at [facility], [address], [place], [place] [address]    Culture FEW KLEBSIELLA PNEUMONIAE  Final   Report Status 09/01/2022 FINAL  Final   Organism ID, Bacteria KLEBSIELLA PNEUMONIAE  Final      Susceptibility   Klebsiella pneumoniae - MIC*    AMPICILLIN >=32 RESISTANT Resistant     CEFEPIME <=0.12 SENSITIVE Sensitive     CEFTAZIDIME <=1 SENSITIVE Sensitive     CEFTRIAXONE <=0.25 SENSITIVE Sensitive     CIPROFLOXACIN <=0.25 SENSITIVE Sensitive     GENTAMICIN <=1 SENSITIVE Sensitive     IMIPENEM <=0.25 SENSITIVE Sensitive     TRIMETH/SULFA <=20 SENSITIVE Sensitive     AMPICILLIN/SULBACTAM >=32 RESISTANT Resistant     PIP/TAZO 16 SENSITIVE Sensitive     * FEW KLEBSIELLA PNEUMONIAE    Labs: CBC: Recent Labs  Lab 08/30/22 0700 08/31/22 0338 09/01/22 0541 09/02/22 0726 09/03/22 0523  WBC 2.6* 3.2* 4.2 3.1* 6.7  NEUTROABS  --   --   --  2.5  --   HGB 10.9* 10.2* 11.8* 12.3  12.1  HCT 33.1* 30.7* 35.5* 35.5* 36.5  MCV 98.2 97.8 97.5 92.9 96.8  PLT 68* 71* 84* 92* 116*   Basic Metabolic Panel: Recent Labs  Lab 08/30/22 0700 08/31/22 0338 09/01/22 0541 09/02/22 0726 09/03/22 0523  NA 143 143 140 142 143  K 3.6 3.9 3.9 3.9 3.8  CL 109 109 109 106 108  CO2 27 28 21* 26 24  GLUCOSE 119* 118* 88 145* 118*  BUN 24* 22 18 25* 24*  CREATININE 0.59 0.58 0.56 0.66 0.64  CALCIUM 7.9* 7.5* 7.7* 8.1* 8.2*  MG 2.1 2.1 2.0 2.0 2.1  PHOS 3.9 3.7 3.5 4.9* 3.4   Liver Function Tests: Recent Labs  Lab 08/29/22 0544  ALBUMIN 2.1*   CBG: Recent Labs  Lab 09/02/22 0307 09/02/22 0743 09/02/22 1204 09/02/22 1647 09/02/22 2020  GLUCAP 149* 138* 126* 117* 111*    Discharge time spent: greater than 30 minutes.  Signed: Delfino Lovett, MD [facility] 09/04/2022

## 2022-09-08 LAB — BLOOD GAS, VENOUS
Bicarbonate: 28.2 mmol/L — ABNORMAL HIGH (ref 20.0–28.0)
O2 Content: 2 L/min
Patient temperature: 37
pH, Ven: 7.49 — ABNORMAL HIGH (ref 7.25–7.43)
pO2, Ven: 144 mmHg — ABNORMAL HIGH (ref 32–45)

## 2022-09-11 ENCOUNTER — Encounter: Payer: Self-pay | Admitting: Unknown Physician Specialty

## 2022-09-15 ENCOUNTER — Inpatient Hospital Stay: Payer: Self-pay | Admitting: Infectious Diseases

## 2022-09-17 ENCOUNTER — Inpatient Hospital Stay: Payer: Self-pay | Admitting: Infectious Diseases

## 2022-12-23 ENCOUNTER — Encounter: Payer: Self-pay | Admitting: Unknown Physician Specialty
